# Patient Record
Sex: Female | Born: 1975 | Race: Black or African American | Hispanic: No | Marital: Married | Smoking: Never smoker
Health system: Southern US, Community
[De-identification: ages and names within clinical notes are randomized; demographics above are authoritative.]

## PROBLEM LIST (undated history)

## (undated) DIAGNOSIS — E785 Hyperlipidemia, unspecified: Secondary | ICD-10-CM

## (undated) DIAGNOSIS — R87619 Unspecified abnormal cytological findings in specimens from cervix uteri: Secondary | ICD-10-CM

## (undated) DIAGNOSIS — N6310 Unspecified lump in the right breast, unspecified quadrant: Secondary | ICD-10-CM

## (undated) DIAGNOSIS — N6029 Fibroadenosis of unspecified breast: Secondary | ICD-10-CM

## (undated) DIAGNOSIS — B009 Herpesviral infection, unspecified: Secondary | ICD-10-CM

## (undated) DIAGNOSIS — N883 Incompetence of cervix uteri: Secondary | ICD-10-CM

## (undated) DIAGNOSIS — IMO0002 Reserved for concepts with insufficient information to code with codable children: Secondary | ICD-10-CM

## (undated) DIAGNOSIS — R000 Tachycardia, unspecified: Secondary | ICD-10-CM

## (undated) DIAGNOSIS — Z309 Encounter for contraceptive management, unspecified: Secondary | ICD-10-CM

## (undated) DIAGNOSIS — R87629 Unspecified abnormal cytological findings in specimens from vagina: Secondary | ICD-10-CM

## (undated) HISTORY — DX: Incompetence of cervix uteri: N88.3

## (undated) HISTORY — PX: CERVICAL CERCLAGE: SHX1329

## (undated) HISTORY — DX: Unspecified abnormal cytological findings in specimens from vagina: R87.629

## (undated) HISTORY — DX: Encounter for contraceptive management, unspecified: Z30.9

## (undated) HISTORY — DX: Herpesviral infection, unspecified: B00.9

## (undated) HISTORY — DX: Unspecified lump in the right breast, unspecified quadrant: N63.10

## (undated) HISTORY — DX: Reserved for concepts with insufficient information to code with codable children: IMO0002

## (undated) HISTORY — PX: LEEP: SHX91

## (undated) HISTORY — DX: Tachycardia, unspecified: R00.0

## (undated) HISTORY — DX: Unspecified abnormal cytological findings in specimens from cervix uteri: R87.619

## (undated) HISTORY — DX: Fibroadenosis of unspecified breast: N60.29

## (undated) HISTORY — DX: Hyperlipidemia, unspecified: E78.5

---

## 2004-04-16 ENCOUNTER — Emergency Department (HOSPITAL_COMMUNITY): Admission: EM | Admit: 2004-04-16 | Discharge: 2004-04-16 | Payer: Self-pay | Admitting: Emergency Medicine

## 2007-07-14 ENCOUNTER — Other Ambulatory Visit: Admission: RE | Admit: 2007-07-14 | Discharge: 2007-07-14 | Payer: Self-pay | Admitting: Obstetrics and Gynecology

## 2008-09-29 ENCOUNTER — Other Ambulatory Visit: Admission: RE | Admit: 2008-09-29 | Discharge: 2008-09-29 | Payer: Self-pay | Admitting: Obstetrics and Gynecology

## 2009-04-19 ENCOUNTER — Encounter (INDEPENDENT_AMBULATORY_CARE_PROVIDER_SITE_OTHER): Payer: Self-pay | Admitting: *Deleted

## 2009-04-19 ENCOUNTER — Emergency Department (HOSPITAL_COMMUNITY): Admission: EM | Admit: 2009-04-19 | Discharge: 2009-04-20 | Payer: Self-pay | Admitting: Emergency Medicine

## 2009-04-19 ENCOUNTER — Encounter: Payer: Self-pay | Admitting: Adult Health

## 2009-04-19 LAB — CONVERTED CEMR LAB
Calcium: 8.8 mg/dL
Chloride: 109 meq/L
Potassium: 3.6 meq/L
Sodium: 138 meq/L

## 2009-04-26 ENCOUNTER — Encounter (INDEPENDENT_AMBULATORY_CARE_PROVIDER_SITE_OTHER): Payer: Self-pay | Admitting: *Deleted

## 2009-04-26 ENCOUNTER — Encounter: Payer: Self-pay | Admitting: Adult Health

## 2009-04-26 ENCOUNTER — Ambulatory Visit: Payer: Self-pay | Admitting: Cardiology

## 2009-04-26 DIAGNOSIS — R002 Palpitations: Secondary | ICD-10-CM

## 2009-04-26 DIAGNOSIS — R000 Tachycardia, unspecified: Secondary | ICD-10-CM | POA: Insufficient documentation

## 2009-04-26 DIAGNOSIS — I498 Other specified cardiac arrhythmias: Secondary | ICD-10-CM

## 2009-04-27 ENCOUNTER — Encounter: Payer: Self-pay | Admitting: Adult Health

## 2009-04-27 LAB — CONVERTED CEMR LAB
Free T4: 1.08 ng/dL (ref 0.80–1.80)
T3, Free: 2.9 pg/mL (ref 2.3–4.2)
TSH: 1.477 microintl units/mL (ref 0.350–4.500)

## 2009-05-04 ENCOUNTER — Telehealth (INDEPENDENT_AMBULATORY_CARE_PROVIDER_SITE_OTHER): Payer: Self-pay | Admitting: *Deleted

## 2009-05-05 ENCOUNTER — Ambulatory Visit (HOSPITAL_COMMUNITY): Admission: RE | Admit: 2009-05-05 | Discharge: 2009-05-05 | Payer: Self-pay | Admitting: Cardiology

## 2009-05-05 ENCOUNTER — Encounter: Payer: Self-pay | Admitting: Cardiology

## 2009-05-05 ENCOUNTER — Ambulatory Visit: Payer: Self-pay | Admitting: Cardiology

## 2009-07-03 ENCOUNTER — Ambulatory Visit: Payer: Self-pay | Admitting: Cardiovascular Disease

## 2010-04-17 NOTE — Miscellaneous (Signed)
Summary: labs bmp 04/19/2009  Clinical Lists Changes  Observations: Added new observation of CALCIUM: 8.8 mg/dL (16/12/9602 54:09) Added new observation of GFR AA: >60 mL/min/1.52m2 (04/19/2009 10:14) Added new observation of GFR: >60 mL/min (04/19/2009 10:14) Added new observation of CREATININE: 0.94 mg/dL (81/19/1478 29:56) Added new observation of BUN: 14 mg/dL (21/30/8657 84:69) Added new observation of BG RANDOM: 99 mg/dL (62/95/2841 32:44) Added new observation of CO2 PLSM/SER: 24 meq/L (04/19/2009 10:14) Added new observation of CL SERUM: 109 meq/L (04/19/2009 10:14) Added new observation of K SERUM: 3.6 meq/L (04/19/2009 10:14) Added new observation of NA: 138 meq/L (04/19/2009 10:14)

## 2010-04-17 NOTE — Assessment & Plan Note (Signed)
Summary: post ED visit on 04/19/09 for palpitations/tg   Visit Type:  Initial Consult Primary Provider:  no primary m.d  CC:  palpitations.  History of Present Illness: [name] is a very nice 35 y/o AAF who presents to the office today as a new patient after ER visit for tachycardia.  She has been having symptoms of palpatations, racing heart for approximately 6 months, occuring more often, lasting longer. Sometimes up to or an hour.  Wakes her up at night at times.  She has associated dizziness with these episodes but no chest pain orSOB.  They go away on their own.  On Apr 19, 2009 while at work, she began another episode of palpatations and came to [facility] ER.  She was found to have a documented HR of 125 bpm per EKG.  Lab work obtained was normal, no anemia or hypokalcemia. Negative pregnancy test.  TSH was not drawn. Heart HR settled down on its own and she was sent home without medication but told to follow-up with cardiology.  She conitnues to have these episodes about every other day.  Preventive Screening-Counseling & Management  Alcohol-Tobacco     Alcohol drinks/day: 0     Smoking Status: never  Caffeine-Diet-Exercise     Caffeine use/day: 3     Caffeine Counseling: decrease use of caffeine  Current Medications (verified): 1)  Kariva 0.15-0.02/0.01 Mg (21/5) Tabs (Desogestrel-Ethinyl Estradiol) .... Take 1 Tab Daily  Allergies (verified): No Known Drug Allergies  Family History: Negative FH of Diabetes, Hypertension, or Coronary Artery Disease  Social History: Tobacco Use - No.  Alcohol Use - no Regular Exercise - no Full Time Single  1 daughterAlcohol drinks/day:  0 Caffeine use/day:  3  Review of Systems       Palpatations, dizziness  All other systems have been reviewed and are negative unless stated above.   Vital Signs:  Patient profile:   35 year old female Height:      64 inches Weight:      193 pounds BMI:     33.25 Pulse rate:   79 /  minute BP sitting:   133 / 88  (right arm)  Vitals Entered By: Dreama Saa, CNA (April 26, 2009 2:39 PM)  Physical Exam  General:  Well developed, well nourished, in no acute distress. Head:  normocephalic and atraumatic Eyes:  PERRLA/EOM intact; conjunctiva and lids normal. Ears:  TM's intact and clear with normal canals and hearing Nose:  no deformity, discharge, inflammation, or lesions Mouth:  Teeth, gums and palate normal. Oral mucosa normal. Neck:  Neck supple, no JVD. No masses, thyromegaly or abnormal cervical nodes. Lungs:  Clear bilaterally to auscultation and percussion. Heart:  Non-displaced PMI, chest non-tender; regular rate and rhythm, S1, S2 without murmurs, rubs or gallops. Carotid upstroke normal, no bruit. Normal abdominal aortic size, no bruits. Femorals normal pulses, no bruits. Pedals normal pulses. No edema, no varicosities. Abdomen:  Bowel sounds positive; abdomen soft and non-tender without masses, organomegaly, or hernias noted. No hepatosplenomegaly. Msk:  Back normal, normal gait. Muscle strength and tone normal. Extremities:  No clubbing or cyanosis. Neurologic:  Alert and oriented x 3. Psych:  Normal affect.   EKG  Procedure date:  04/26/2009  Findings:      Normal sinus rhythm with rate of:  87bpm.  Sinus arrythmia  Impression & Recommendations:  Problem # 1:  SINUS TACHYCARDIA (ICD-427.89) Will plan a cardionet monitor for 2 weeks to ascertain frequency of tachycardia  and evaluate for arrythmias.  No evidence of WPW on today's EKG or review of EKG from ER.  Will also have ECHO completed.  The patient was seen by Dr. Dietrich Pates who agrees with my plan and assessment. Orders: Cardionet/Event Monitor (Cardionet/Event)  Problem # 2:  R/O HYPERTHYROIDISM (ICD-242.90) To evaluate possible thyroid disease as etiology, will check thyroid profile. Orders: T-TSH [phone]) T-T3, Free [phone]) T-T4, Free [phone])  Other Orders: 2-D  Echocardiogram (2D Echo)  Patient Instructions: 1)  Your physician recommends that you schedule a follow-up appointment in: Post event monitor 2)  Your physician recommends that you return for lab work DP:OEUMP 3)  Your physician has requested that you have an echocardiogram.  Echocardiography is a painless test that uses sound waves to create images of your heart. It provides your doctor with information about the size and shape of your heart and how well your heart's chambers and valves are working.  This procedure takes approximately one hour. There are no restrictions for this procedure.

## 2010-04-17 NOTE — Assessment & Plan Note (Signed)
Summary: due for f/u/Eileen Combs has no available appts./tg   Visit Type:  Follow-up Primary Provider:  no primary m.d  CC:  fu echo per kathyrn.  History of Present Illness: Patient seen 2/11 by Bailey Mech for palpitations.  Seen in ER and sent home.  Reviewed recent echo which was normal .  TSH/labs are also normal.  Since last seen she has done well with no real symptoms.  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  I think her palpitaiotns are benign and need no further w/u  Current Problems (verified): 1)  R/O Hyperthyroidism  (ICD-242.90) 2)  Palpitations  (ICD-785.1) 3)  Tachycardia  (ICD-785.0) 4)  Sinus Tachycardia  (ICD-427.89)  Current Medications (verified): 1)  Kariva 0.15-0.02/0.01 Mg (21/5) Tabs (Desogestrel-Ethinyl Estradiol) .... Take 1 Tab Daily  Allergies (verified): No Known Drug Allergies  Past History:  Past Medical History: Last updated: 04/26/2009 Current Problems:  SINUS TACHYCARDIA (ICD-427.89)  Past Surgical History: Last updated: 04/26/2009 NO PAST SURGICAL HISTORY  Family History: Last updated: 04/26/2009 Negative FH of Diabetes, Hypertension, or Coronary Artery Disease  Social History: Last updated: 04/26/2009 Tobacco Use - No.  Alcohol Use - no Regular Exercise - no Full Time Single  1 daughter  Review of Systems       Denies fever, malais, weight loss, blurry vision, decreased visual acuity, cough, sputum, SOB, hemoptysis, pleuritic pain, , heartburn, abdominal pain, melena, lower extremity edema, claudication, or rash.   Vital Signs:  Patient profile:   35 year old female Weight:      201 pounds Pulse rate:   81 / minute BP sitting:   124 / 79  (right arm)  Vitals Entered By: Dreama Saa, CNA (July 03, 2009 2:14 PM)  Physical Exam  General:  Affect appropriate Healthy:  appears stated age HEENT: normal Neck supple with no adenopathy JVP normal no bruits no  thyromegaly Lungs clear with no wheezing and good diaphragmatic motion Heart:  S1/S2 no murmur,rub, gallop or click PMI normal Abdomen: benighn, BS positve, no tenderness, no AAA no bruit.  No HSM or HJR Distal pulses intact with no bruits No edema Neuro non-focal Skin warm and dry    Impression & Recommendations:  Problem # 1:  PALPITATIONS (ICD-785.1) Benign with normal Tele, echo, ECG and labs.  Reassurance  Patient Instructions: 1)  Your physician recommends that you schedule a follow-up appointment in: as needed  2)  Your physician recommends that you continue on your current medications as directed. Please refer to the Current Medication list given to you today.

## 2010-04-17 NOTE — Progress Notes (Signed)
  Phone Note Outgoing Call   Call placed by: Teressa Lower RN,  May 04, 2009 12:34 PM Call placed to: Patient Summary of Call: pt is wearing [employer] ACT monitor:  pt had 1 episode ST,SVT rate 195    < 1 minute in duration, pt had some "quickening" in chest but denied actual pain Initial call taken by: Teressa Lower RN,  May 04, 2009 12:39 PM

## 2010-06-06 LAB — CBC
HCT: 34.5 % — ABNORMAL LOW (ref 36.0–46.0)
MCHC: 34.8 g/dL (ref 30.0–36.0)
MCV: 86.6 fL (ref 78.0–100.0)
Platelets: 248 10*3/uL (ref 150–400)
RDW: 13 % (ref 11.5–15.5)
WBC: 5.9 10*3/uL (ref 4.0–10.5)

## 2010-06-06 LAB — PREGNANCY, URINE: Preg Test, Ur: NEGATIVE

## 2010-06-06 LAB — DIFFERENTIAL
Basophils Absolute: 0 10*3/uL (ref 0.0–0.1)
Basophils Relative: 0 % (ref 0–1)
Eosinophils Absolute: 0 10*3/uL (ref 0.0–0.7)
Eosinophils Relative: 1 % (ref 0–5)
Neutrophils Relative %: 72 % (ref 43–77)

## 2010-06-06 LAB — BASIC METABOLIC PANEL
BUN: 14 mg/dL (ref 6–23)
GFR calc non Af Amer: >60 mL/min (ref >60)
Glucose, Bld: 99 mg/dL (ref 70–99)
Potassium: 3.6 mEq/L (ref 3.5–5.1)

## 2010-06-06 LAB — URINALYSIS, ROUTINE W REFLEX MICROSCOPIC
Bilirubin Urine: NEGATIVE
Ketones, ur: NEGATIVE mg/dL
Nitrite: NEGATIVE
Urobilinogen, UA: 0.2 mg/dL (ref 0.0–1.0)
pH: 6 (ref 5.0–8.0)

## 2010-07-10 ENCOUNTER — Other Ambulatory Visit (HOSPITAL_COMMUNITY)
Admission: RE | Admit: 2010-07-10 | Discharge: 2010-07-10 | Disposition: A | Discharge status: Home / Self Care | Payer: Self-pay | Source: Ambulatory Visit | Attending: Obstetrics and Gynecology | Admitting: Obstetrics and Gynecology

## 2010-07-10 ENCOUNTER — Other Ambulatory Visit: Payer: Self-pay | Admitting: Adult Health

## 2010-07-10 DIAGNOSIS — Z01419 Encounter for gynecological examination (general) (routine) without abnormal findings: Secondary | ICD-10-CM | POA: Insufficient documentation

## 2010-12-24 ENCOUNTER — Encounter: Payer: Self-pay | Admitting: Cardiovascular Disease

## 2011-07-11 ENCOUNTER — Other Ambulatory Visit: Payer: Self-pay | Admitting: Obstetrics and Gynecology

## 2011-10-08 ENCOUNTER — Emergency Department (HOSPITAL_COMMUNITY)
Admission: EM | Admit: 2011-10-08 | Discharge: 2011-10-08 | Disposition: A | Discharge status: Home / Self Care | Payer: PRIVATE HEALTH INSURANCE | Attending: Emergency Medicine | Admitting: Emergency Medicine

## 2011-10-08 ENCOUNTER — Encounter (HOSPITAL_COMMUNITY): Payer: Self-pay

## 2011-10-08 DIAGNOSIS — R0602 Shortness of breath: Secondary | ICD-10-CM | POA: Insufficient documentation

## 2011-10-08 DIAGNOSIS — R002 Palpitations: Secondary | ICD-10-CM | POA: Insufficient documentation

## 2011-10-08 DIAGNOSIS — R42 Dizziness and giddiness: Secondary | ICD-10-CM | POA: Insufficient documentation

## 2011-10-08 LAB — CBC WITH DIFFERENTIAL/PLATELET
Basophils Absolute: 0 10*3/uL (ref 0.0–0.1)
Eosinophils Relative: 2 % (ref 0–5)
Lymphocytes Relative: 32 % (ref 12–46)
Lymphs Abs: 1.7 10*3/uL (ref 0.7–4.0)
MCV: 85.3 fL (ref 78.0–100.0)
Neutrophils Relative %: 59 % (ref 43–77)
Platelets: 329 10*3/uL (ref 150–400)
RBC: 4.91 MIL/uL (ref 3.87–5.11)
RDW: 12.5 % (ref 11.5–15.5)
WBC: 5.3 10*3/uL (ref 4.0–10.5)

## 2011-10-08 LAB — BASIC METABOLIC PANEL
CO2: 24 mEq/L (ref 19–32)
Calcium: 10.2 mg/dL (ref 8.4–10.5)
GFR calc non Af Amer: 65 mL/min — ABNORMAL LOW (ref >90)
Glucose, Bld: 104 mg/dL — ABNORMAL HIGH (ref 70–99)
Potassium: 3.9 mEq/L (ref 3.5–5.1)
Sodium: 138 mEq/L (ref 135–145)

## 2011-10-08 MED ORDER — SODIUM CHLORIDE 0.9 % IV BOLUS (SEPSIS)
1000.0000 mL | Freq: Once | INTRAVENOUS | Status: AC
  Administered 2011-10-08: 1000 mL via INTRAVENOUS

## 2011-10-08 MED ORDER — METOPROLOL TARTRATE 50 MG PO TABS
25.0000 mg | ORAL_TABLET | Freq: Once | ORAL | Status: AC
  Administered 2011-10-08: 19:00:00 via ORAL
  Filled 2011-10-08: qty 1

## 2011-10-08 MED ORDER — METOPROLOL TARTRATE 25 MG PO TABS: 25.0000 mg | ORAL_TABLET | Freq: Two times a day (BID) | ORAL | Status: DC

## 2011-10-08 NOTE — ED Notes (Signed)
Pt reports heart has been racing since 2 pm.  C/O SOB.  Went to [facility] and was sent here for evaluation.

## 2011-10-08 NOTE — ED Notes (Signed)
Patient ambulated at doctor's request.  Patient's resting heart rate was 95 bpm.  After walking length of hall - patient's was at 125 bpm,

## 2011-10-08 NOTE — ED Provider Notes (Signed)
History     CSN: [id number]  Arrival date & time 10/08/11  1613   First MD Initiated Contact with Patient 10/08/11 1627      Chief Complaint  Patient presents with  . Tachycardia     Patient is a 36 y.o. female presenting with palpitations. The history is provided by the patient.  Palpitations  This is a new problem. The current episode started 1 to 2 hours ago. The problem occurs constantly. The problem has been gradually worsening. Associated with: nothing. On average, each episode lasts 2 hours. Associated symptoms include dizziness and shortness of breath. Pertinent negatives include no diaphoresis, no fever, no chest pain, no orthopnea, no PND, no syncope, no vomiting, no headaches and no cough. She has tried nothing for the symptoms. The treatment provided no relief.  pt reports similar history previously but never had a diagnosis She denies chest pain No h/o DVT/PE She is a nonsmoker No h/o syncope previously  Past Medical History  Diagnosis Date  . Sinus tachycardia     History reviewed. No pertinent past surgical history.  Family History  Problem Relation Age of Onset  . Hypertension Neg Hx   . Coronary artery disease Neg Hx   . Diabetes Neg Hx     History  Substance Use Topics  . Smoking status: Never Smoker   . Smokeless tobacco: Not on file  . Alcohol Use: No    OB History    Grav Para Term Preterm Abortions TAB SAB Ect Mult Living                  Review of Systems  Constitutional: Negative for fever and diaphoresis.  Respiratory: Positive for shortness of breath. Negative for cough.   Cardiovascular: Positive for palpitations. Negative for chest pain, orthopnea, syncope and PND.  Gastrointestinal: Negative for vomiting and diarrhea.  Genitourinary: Negative for vaginal bleeding.  Neurological: Positive for dizziness. Negative for headaches.  All other systems reviewed and are negative.    Allergies  Review of patient's allergies indicates no  known allergies.  Home Medications   Current Outpatient Rx  Name Route Sig Dispense Refill  . DESOGESTREL-ETHINYL ESTRADIOL 0.15-0.02/0.01 MG (21/5) PO TABS Oral Take 1 tablet by mouth daily.        BP 142/96  Pulse 164  Temp 98.3 F (36.8 C) (Oral)  Ht 5\' 4"  (1.626 m)  Wt 185 lb (83.915 kg)  BMI 31.76 kg/m2  SpO2 99%  LMP 09/08/2011  Physical Exam CONSTITUTIONAL: Well developed/well nourished HEAD AND FACE: Normocephalic/atraumatic EYES: EOMI/PERRL ENMT: Mucous membranes moist NECK: supple no meningeal signs SPINE:entire spine nontender CV: tachycardia, s1/s2, no murmurs/rubs/gallops noted LUNGS: Lungs are clear to auscultation bilaterally, no apparent distress ABDOMEN: soft, nontender, no rebound or guarding GU:no cva tenderness NEURO: Pt is awake/alert, moves all extremitiesx4 EXTREMITIES: pulses normal, full ROM, no edema noted SKIN: warm, color normal PSYCH: no abnormalities of mood noted  ED Course  Procedures   Labs Reviewed  CBC WITH DIFFERENTIAL  BASIC METABOLIC PANEL    5:35 PM Pt improved HR around 100 Will call cardiology to see if meds should be started 5:50 PM D/w dr Shirlee Latch We discussed normal echo/thyroid studies previously Discussed that HR is now in the 90s I doubt WPW.  I doubt PE, or ACS at this time He recommended start metoprolol 25 po bid and f/u Pt felt well during ambulation, no complaints  Repeat EKG  Date: 10/08/2011 1833  Rate: 94  Rhythm: normal sinus  rhythm  QRS Axis: normal  Intervals: normal  ST/T Wave abnormalities: normal  Conduction Disutrbances:none    MDM  Nursing notes including past medical history and social history reviewed and considered in documentation All labs/vitals reviewed and considered Previous records reviewed and considered - h/o palpitations, had normal echo in 2011, had normal thyroid studies per cardiology noted       Date: 10/08/2011  Rate: 160  Rhythm: sinus tachycardia  QRS Axis:  normal  Intervals: normal  ST/T Wave abnormalities: normal  Conduction Disutrbances:none  Narrative Interpretation:   Old EKG Reviewed: changes noted Rate is faster than previous EKG   Joya Gaskins, MD 10/08/11 1913

## 2011-10-14 ENCOUNTER — Encounter: Payer: Self-pay | Admitting: Adult Health

## 2011-10-14 ENCOUNTER — Ambulatory Visit (INDEPENDENT_AMBULATORY_CARE_PROVIDER_SITE_OTHER): Payer: PRIVATE HEALTH INSURANCE | Admitting: Adult Health

## 2011-10-14 VITALS — BP 124/76 | HR 88 | Ht 64.0 in | Wt 192.1 lb

## 2011-10-14 DIAGNOSIS — I498 Other specified cardiac arrhythmias: Secondary | ICD-10-CM

## 2011-10-14 MED ORDER — METOPROLOL TARTRATE 25 MG PO TABS: 25.0000 mg | ORAL_TABLET | Freq: Two times a day (BID) | ORAL | Status: DC

## 2011-10-14 NOTE — Progress Notes (Signed)
   HPI: Ms. Eileen Combs is a 36 year old female patient, followed by Dr. Eden Emms, in the past. She is here to be seen on followup after coming to emergency room secondary to palpitations on 10/08/2011. She had been seen in the office in 2011  by Dr. Charlton Haws after ER visit for same in 2011. Prior to that an echocardiogram, outpatient heart monitor, and TSH were performed. All were found to be within normal limits at that time. He felt her palpitations were benign.. She was to be seen on a prn basis.    She was seen in the emergency room on 10/08/2011 with an episode of palpitations and tachycardia lasting approximately 2 hours. Emergency room vital signs reveal a pulse of 164 beats per minute with blood pressure of 142/96. She was treated with metoprolol 25 mg by mouth.She was  given a bolus of normal saline. Followup EKG revealed normal sinus rhythm with a rate of 94 beats per minute approximately 2 hours later.. Dr. Bebe Shaggy, ER physician, did speak with Dr. Cherie Ouch cardiology who advised that she be sent home on metoprolol 25 mg twice a day.    Since beginning the medication she has not had any recurrent events. Blood pressure normalized. She denies excessive use of caffeine. She does not have a history of anxiety or panic attacks. She wonders if this could be related to her birth control medication.   No Known Allergies  Current Outpatient Prescriptions  Medication Sig Dispense Refill  . desogestrel-ethinyl estradiol (KARIVA,AZURETTE,MIRCETTE) 0.15-0.02/0.01 MG (21/5) tablet Take 1 tablet by mouth daily.        . metoprolol tartrate (LOPRESSOR) 25 MG tablet Take 1 tablet (25 mg total) by mouth 2 (two) times daily.  60 tablet  10  . DISCONTD: metoprolol (LOPRESSOR) 25 MG tablet Take 1 tablet (25 mg total) by mouth 2 (two) times daily.  14 tablet  0  . DISCONTD: metoprolol tartrate (LOPRESSOR) 25 MG tablet Take 1 tablet (25 mg total) by mouth 2 (two) times daily.  60 tablet  10    Past Medical  History  Diagnosis Date  . Sinus tachycardia      ZOX:WRUEAV of systems complete and found to be negative unless listed above  PHYSICAL EXAM BP 124/76  Pulse 88  Ht 5\' 4"  (1.626 m)  Wt 192 lb 1.9 oz (87.145 kg)  BMI 32.98 kg/m2  LMP 09/08/2011  General: Well developed, well nourished, in no acute distress Head: Eyes PERRLA, No xanthomas.   Normal cephalic and atramatic  Lungs: Clear bilaterally to auscultation and percussion. Heart: HRRR S1 S2, without MRG.  Pulses are 2+ & equal.            No carotid bruit. No JVD.  No abdominal bruits. No femoral bruits. Abdomen: Bowel sounds are positive, abdomen soft and non-tender without masses or                  Hernia's noted. Msk:  Back normal, normal gait. Normal strength and tone for age. Extremities: No clubbing, cyanosis or edema.  DP +1 Neuro: Alert and oriented X 3. Psych:  Good affect, responds appropriately  EKG:NSR no evidence of WPW.  ASSESSMENT AND PLAN

## 2011-10-14 NOTE — Assessment & Plan Note (Addendum)
I have reviewed the EKG taken during her ER visit. I have also asked Dr. Dietrich Pates to over read the EKG with me. There is no evidence of pre-excitation abnormalities. It appears that she was having an episode of SVT. This resolved with use of metoprolol. I will continue her on metoprolol 25 mg twice a day, Rx provided.. May consider repeating outpatient heart monitor if she has breakthrough tachycardia. May consider sending her to see EP specialist if she continues to have symptoms. At this time we will not recheck TSH or echocardiogram.   Concerning the use of birth control medication causing these episodes,; she has them very intermittently , they are not exacerbated by any particular event or activity. I have advised her to followup with her OB/GYN specialist to discuss this further. We will see her again in 6 months unless she is symptomatic.

## 2011-10-14 NOTE — Patient Instructions (Addendum)
Your physician recommends that you continue on your current medications as directed. Please refer to the Current Medication list given to you today.  Your physician wants you to follow-up in: 6 months with Kathryn. You will receive a reminder letter in the mail two months in advance. If you don't receive a letter, please call our office to schedule the follow-up appointment.  

## 2012-07-09 ENCOUNTER — Encounter: Payer: Self-pay | Admitting: *Deleted

## 2012-07-10 ENCOUNTER — Other Ambulatory Visit (HOSPITAL_COMMUNITY)
Admission: RE | Admit: 2012-07-10 | Discharge: 2012-07-10 | Disposition: A | Discharge status: Home / Self Care | Payer: PRIVATE HEALTH INSURANCE | Source: Ambulatory Visit | Attending: Adult Health | Admitting: Adult Health

## 2012-07-10 ENCOUNTER — Ambulatory Visit (INDEPENDENT_AMBULATORY_CARE_PROVIDER_SITE_OTHER): Payer: PRIVATE HEALTH INSURANCE | Admitting: Adult Health

## 2012-07-10 ENCOUNTER — Encounter: Payer: Self-pay | Admitting: Adult Health

## 2012-07-10 VITALS — BP 120/76 | HR 76 | Ht 64.0 in | Wt 210.0 lb

## 2012-07-10 DIAGNOSIS — Z01419 Encounter for gynecological examination (general) (routine) without abnormal findings: Secondary | ICD-10-CM

## 2012-07-10 DIAGNOSIS — Z1151 Encounter for screening for human papillomavirus (HPV): Secondary | ICD-10-CM | POA: Insufficient documentation

## 2012-07-10 DIAGNOSIS — Z309 Encounter for contraceptive management, unspecified: Secondary | ICD-10-CM

## 2012-07-10 DIAGNOSIS — Z0000 Encounter for general adult medical examination without abnormal findings: Secondary | ICD-10-CM

## 2012-07-10 MED ORDER — NORETHIN-ETH ESTRAD-FE BIPHAS 1 MG-10 MCG / 10 MCG PO TABS: 1.0000 | ORAL_TABLET | Freq: Every day | ORAL | Status: DC

## 2012-07-10 NOTE — Patient Instructions (Addendum)
Start lo loestrin this Sunday and use condoms x 1 month  Follow here in 3 months Sign up for my chart

## 2012-07-10 NOTE — Progress Notes (Signed)
Patient ID: Eileen Combs, female   DOB: Apr 30, 1975, 37 y.o.   MRN: [id number] History of Present Illness: Eileen Combs is a 37 year old black female in for a pap and physical.   Current Medications, Allergies, Past Medical History, Past Surgical History, Family History and Social History were reviewed in [facility] electronic medical record.     Review of Systems:Patient denies any headaches, blurred vision, shortness of breath, chest pain, abdominal pain, problems with bowel movements, urination, or intercourse. No musculoskeletal pain, no mood changes. She has had palpations and is taking lopressor, and her PCP said her birth control may contribute to this.Had labs that were normal per pt. This has been happening over a year.   Physical Exam:Blood pressure 120/76, pulse 76, height 5\' 4"  (1.626 m), weight 210 lb (95.255 kg), last menstrual period 06/19/2012. General:  Well developed, well nourished, no acute distress Skin:  Warm and dry Neck:  Midline trachea, normal thyroid Lungs; Clear to auscultation bilaterally Breast:  No dominant palpable mass, retraction, or nipple discharge Cardiovascular: Regular rate and rhythm Abdomen:  Soft, non tender, no hepatosplenomegaly Pelvic:  External genitalia is normal in appearance.  The vagina is normal in appearance. The cervix is bulbous. Pap performed with HPV.  Uterus is felt to be normal size, shape, and contour.  No adnexal masses or tenderness noted. Extremities:  No swelling or varicosities noted Psych:  No mood changes, alert and cooperative.  Impression: Yearly exam  Contraceptive management  Plan: Change birth control pills to Lo loestrin which is a lower dose. Start them this Sunday and use condoms x 1 pack, samples given: Number of samples 4  Lot number [id number]    Exp date 5/15 Follow up in 3 months Physical in 1 year

## 2012-07-12 ENCOUNTER — Other Ambulatory Visit: Payer: Self-pay | Admitting: Obstetrics and Gynecology

## 2012-08-11 ENCOUNTER — Telehealth: Payer: Self-pay | Admitting: Obstetrics and Gynecology

## 2012-08-11 NOTE — Telephone Encounter (Signed)
Pt informed of WNL Pap results (07/10/12).

## 2012-10-09 ENCOUNTER — Ambulatory Visit: Payer: PRIVATE HEALTH INSURANCE | Admitting: Adult Health

## 2012-10-13 ENCOUNTER — Encounter: Payer: Self-pay | Admitting: Adult Health

## 2012-10-13 ENCOUNTER — Ambulatory Visit (INDEPENDENT_AMBULATORY_CARE_PROVIDER_SITE_OTHER): Payer: PRIVATE HEALTH INSURANCE | Admitting: Adult Health

## 2012-10-13 VITALS — BP 110/80 | Ht 64.0 in | Wt 209.0 lb

## 2012-10-13 DIAGNOSIS — Z3049 Encounter for surveillance of other contraceptives: Secondary | ICD-10-CM

## 2012-10-13 DIAGNOSIS — Z309 Encounter for contraceptive management, unspecified: Secondary | ICD-10-CM

## 2012-10-13 HISTORY — DX: Encounter for contraceptive management, unspecified: Z30.9

## 2012-10-13 MED ORDER — DESOGESTREL-ETHINYL ESTRADIOL 0.15-0.02/0.01 MG (21/5) PO TABS: ORAL_TABLET | ORAL | Status: DC

## 2012-10-13 NOTE — Patient Instructions (Addendum)
Finish the lo loesrtin and the start [place] Use condoms Follow up in 1 year

## 2012-10-13 NOTE — Progress Notes (Signed)
Subjective:     Patient ID: Eileen Combs, female   DOB: November 07, 1975, 37 y.o.   MRN: [id number]  HPI Eileen Combs is back in follow up of changing OCs to see if helps with palpitations and it did not and she wants to go back to prior pills.which was [place].   Review of Systems Positives in HPI Reviewed past medical,surgical, social and family history. Reviewed medications and allergies.     Objective:   Physical Exam BP 110/80  Ht 5\' 4"  (1.626 m)  Wt 209 lb (94.802 kg)  BMI 35.86 kg/m2  LMP 09/30/2012   Talk only, she wants to go back to [employer], she had a 2 week period with lo loestin x 1.and no change in heart palpitations.  Assessment:      Contraceptive management    Plan:      Refilled kariva x 1 year to start after finishes this pack of lo loestrin and use condoms Follow up in 1 year and prn

## 2012-11-05 ENCOUNTER — Other Ambulatory Visit: Payer: Self-pay | Admitting: Adult Health

## 2012-11-05 MED ORDER — DESOGESTREL-ETHINYL ESTRADIOL 0.15-0.02/0.01 MG (21/5) PO TABS: ORAL_TABLET | ORAL | Status: DC

## 2013-05-20 ENCOUNTER — Ambulatory Visit (INDEPENDENT_AMBULATORY_CARE_PROVIDER_SITE_OTHER): Payer: 59 | Admitting: Adult Health

## 2013-05-20 ENCOUNTER — Encounter: Payer: Self-pay | Admitting: Adult Health

## 2013-05-20 ENCOUNTER — Encounter (INDEPENDENT_AMBULATORY_CARE_PROVIDER_SITE_OTHER): Payer: Self-pay

## 2013-05-20 VITALS — BP 120/82 | Ht 64.0 in | Wt 218.0 lb

## 2013-05-20 DIAGNOSIS — N63 Unspecified lump in unspecified breast: Secondary | ICD-10-CM

## 2013-05-20 DIAGNOSIS — N6310 Unspecified lump in the right breast, unspecified quadrant: Secondary | ICD-10-CM

## 2013-05-20 HISTORY — DX: Unspecified lump in the right breast, unspecified quadrant: N63.10

## 2013-05-20 NOTE — Progress Notes (Signed)
Subjective:     Patient ID: Eileen Combs, female   DOB: 06-22-1975, 38 y.o.   MRN: [id number]  HPI Eileen Combs is a 38 year old black female in complaining of right breast lump x 1 month.can be tender.  Review of Systems See HPI Reviewed past medical,surgical, social and family history. Reviewed medications and allergies.     Objective:   Physical Exam BP 120/82  Ht 5\' 4"  (1.626 m)  Wt 218 lb (98.884 kg)  BMI 37.40 kg/m2  LMP 05/19/2013    Skin warm and dry,  Breasts:no dominate palpable mass, retraction or nipple discharge on left, on right no retraction or nipple discharge but has marble size mass under nipple, tender if she misses with it.Has bilateral irregularities. .  Assessment:     Right breast mass    Plan:     [facility] bilateral [facility] handout on breast cyst Follow up prn

## 2013-05-20 NOTE — Patient Instructions (Signed)
Get mammogram 3/18 at 1:45 pm be there at 1:30 pm  Follow up prn Breast Cyst A breast cyst is a sac in the breast that is filled with fluid. Breast cysts are common in women. Women can have one or many cysts. When the breasts contain many cysts, it is usually due to a noncancerous (benign) condition called fibrocystic change. These lumps form under the influence of female hormones (estrogen and progesterone). The lumps are most often located in the upper, outer portion of the breast. They are often more swollen, painful, and tender before your period starts. They usually disappear after menopause, unless you are on hormone therapy.  There are several types of cysts:  Macrocyst. This is a cyst that is about 2 in. (5.1 cm) in diameter.   Microcyst. This is a tiny cyst that you cannot feel but can be seen with a mammogram or an ultrasound.   Galactocele. This is a cyst containing milk that may develop if you suddenly stop breastfeeding.   Sebaceous cyst of the skin. This type of cyst is not in the breast tissue itself. Breast cysts do not increase your risk of breast cancer. However, they must be monitored closely because they can be cancerous.  CAUSES  It is not known exactly what causes a breast cyst to form. Possible causes include:  An overgrowth of milk glands and connective tissue in the breast can block the milk glands, causing them to fill with fluid.   Scar tissue in the breast from previous surgery may block the glands, causing a cyst.  RISK FACTORS Estrogen may influence the development of a breast cyst.  SIGNS AND SYMPTOMS   Feeling a smooth, round, soft lump (like a grape) in the breast that is easily moveable.   Breast discomfort or pain.  Increase in size of the lump before your menstrual period and decrease in its size after your menstrual period.  DIAGNOSIS  A cyst can be felt during a physical exam by your health care provider. A breast X-ray exam (mammogram) and  ultrasonography will be done to confirm the diagnosis. Fluid may be removed from the cyst with a needle (fine needle aspiration) to make sure the cyst is not cancerous.  TREATMENT  Treatment may not be necessary. Your health care provider may monitor the cyst to see if it goes away on its own. If treatment is needed, it may include:  Hormone treatment.   Needle aspiration. There is a chance of the cyst coming back after aspiration.   Surgery to remove the whole cyst.  HOME CARE INSTRUCTIONS   Keep all follow-up appointments with your health care provider.  See your health care provider regularly:  Get a yearly exam by your health care provider.  Have a clinical breast exam by a health care provider every 1 3 years if you are 29 38 years of age. After age 71 years, you should have the exam every year.   Get mammogram tests as directed by your health care provider.   Understand the normal appearance and feel of your breasts and perform breast self-exams.   Only take over-the-counter or prescription medicines as directed by your health care provider.   Wear a supportive bra, especially when exercising.   Avoid caffeine.   Reduce your salt intake, especially before your menstrual period. Too much salt can cause fluid retention, breast swelling, and discomfort.  SEEK MEDICAL CARE IF:   You feel, or think you feel, a lump in your  breast.   You notice that both breasts look or feel different than usual.   Your breast is still causing pain after your menstrual period is over.   You need medicine for breast pain and swelling that occurs with your menstrual period.  SEEK IMMEDIATE MEDICAL CARE IF:   You have severe pain, tenderness, redness, or warmth in your breast.   You have nipple discharge or bleeding.   Your breast lump becomes hard and painful.   You find new lumps or bumps that were not there before.   You feel lumps in your armpit (axilla).   You  notice dimpling or wrinkling of the breast or nipple.   You have a fever.  MAKE SURE YOU:  Understand these instructions.  Will watch your condition.  Will get help right away if you are not doing well or get worse. Document Released: 03/04/2005 Document Revised: 11/04/2012 Document Reviewed: 10/01/2012 [facility] Patient Information 2014 [place], [place].

## 2013-05-26 ENCOUNTER — Encounter (HOSPITAL_COMMUNITY): Payer: PRIVATE HEALTH INSURANCE

## 2013-06-02 ENCOUNTER — Other Ambulatory Visit: Payer: Self-pay | Admitting: Adult Health

## 2013-06-02 ENCOUNTER — Ambulatory Visit (HOSPITAL_COMMUNITY)
Admission: RE | Admit: 2013-06-02 | Discharge: 2013-06-02 | Disposition: A | Discharge status: Home / Self Care | Payer: 59 | Source: Ambulatory Visit | Attending: Adult Health | Admitting: Adult Health

## 2013-06-02 DIAGNOSIS — N6310 Unspecified lump in the right breast, unspecified quadrant: Secondary | ICD-10-CM

## 2013-06-02 DIAGNOSIS — N63 Unspecified lump in unspecified breast: Secondary | ICD-10-CM | POA: Insufficient documentation

## 2013-06-03 ENCOUNTER — Telehealth: Payer: Self-pay | Admitting: *Deleted

## 2013-06-03 NOTE — Telephone Encounter (Signed)
Pt has question about diet pills?

## 2013-06-04 NOTE — Telephone Encounter (Signed)
Left message I called and she can call back

## 2013-06-04 NOTE — Telephone Encounter (Signed)
Left message  To call back next week

## 2013-06-07 ENCOUNTER — Telehealth: Payer: Self-pay | Admitting: Adult Health

## 2013-06-07 NOTE — Telephone Encounter (Signed)
Make appt to get labs and discuss

## 2013-06-07 NOTE — Telephone Encounter (Signed)
This patient wants to talk to you about diet pills.

## 2013-06-11 ENCOUNTER — Other Ambulatory Visit: Payer: 59

## 2013-06-11 DIAGNOSIS — Z0000 Encounter for general adult medical examination without abnormal findings: Secondary | ICD-10-CM

## 2013-06-11 DIAGNOSIS — Z13220 Encounter for screening for lipoid disorders: Secondary | ICD-10-CM

## 2013-06-11 DIAGNOSIS — Z1329 Encounter for screening for other suspected endocrine disorder: Secondary | ICD-10-CM

## 2013-06-11 LAB — COMPREHENSIVE METABOLIC PANEL
ALK PHOS: 62 U/L (ref 39–117)
ALT: 10 U/L (ref 0–35)
AST: 14 U/L (ref 0–37)
Albumin: 3.8 g/dL (ref 3.5–5.2)
BILIRUBIN TOTAL: 0.8 mg/dL (ref 0.2–1.2)
BUN: 13 mg/dL (ref 6–23)
CO2: 23 mEq/L (ref 19–32)
CREATININE: 0.92 mg/dL (ref 0.50–1.10)
Calcium: 9.3 mg/dL (ref 8.4–10.5)
Chloride: 104 mEq/L (ref 96–112)
Glucose, Bld: 81 mg/dL (ref 70–99)
POTASSIUM: 5.2 meq/L (ref 3.5–5.3)
Sodium: 138 mEq/L (ref 135–145)
Total Protein: 7 g/dL (ref 6.0–8.3)

## 2013-06-11 LAB — LIPID PANEL
CHOL/HDL RATIO: 2.8 ratio
Cholesterol: 197 mg/dL (ref 0–200)
HDL: 70 mg/dL (ref >39)
LDL CALC: 115 mg/dL — AB (ref 0–99)
TRIGLYCERIDES: 60 mg/dL (ref <150)
VLDL: 12 mg/dL (ref 0–40)

## 2013-06-11 LAB — CBC
HEMATOCRIT: 36.2 % (ref 36.0–46.0)
Hemoglobin: 12.3 g/dL (ref 12.0–15.0)
MCH: 28.9 pg (ref 26.0–34.0)
MCHC: 34 g/dL (ref 30.0–36.0)
MCV: 85 fL (ref 78.0–100.0)
Platelets: 329 10*3/uL (ref 150–400)
RBC: 4.26 MIL/uL (ref 3.87–5.11)
RDW: 14.3 % (ref 11.5–15.5)
WBC: 6 10*3/uL (ref 4.0–10.5)

## 2013-06-12 ENCOUNTER — Telehealth: Payer: Self-pay | Admitting: Adult Health

## 2013-06-12 LAB — TSH: TSH: 1.424 u[IU]/mL (ref 0.350–4.500)

## 2013-06-12 NOTE — Telephone Encounter (Signed)
Left message labs look good can call me back

## 2013-09-28 ENCOUNTER — Ambulatory Visit (INDEPENDENT_AMBULATORY_CARE_PROVIDER_SITE_OTHER): Payer: 59 | Admitting: Adult Health

## 2013-09-28 ENCOUNTER — Encounter: Payer: Self-pay | Admitting: Adult Health

## 2013-09-28 VITALS — BP 140/88 | HR 76 | Ht 64.0 in | Wt 217.5 lb

## 2013-09-28 DIAGNOSIS — R350 Frequency of micturition: Secondary | ICD-10-CM

## 2013-09-28 DIAGNOSIS — Z3041 Encounter for surveillance of contraceptive pills: Secondary | ICD-10-CM

## 2013-09-28 DIAGNOSIS — Z01419 Encounter for gynecological examination (general) (routine) without abnormal findings: Secondary | ICD-10-CM

## 2013-09-28 LAB — POCT URINALYSIS DIPSTICK
Blood, UA: NEGATIVE
Glucose, UA: NEGATIVE
LEUKOCYTES UA: NEGATIVE
Nitrite, UA: NEGATIVE

## 2013-09-28 MED ORDER — DESOGESTREL-ETHINYL ESTRADIOL 0.15-0.02/0.01 MG (21/5) PO TABS: ORAL_TABLET | ORAL | Status: DC

## 2013-09-28 NOTE — Patient Instructions (Signed)

## 2013-09-28 NOTE — Progress Notes (Signed)
Patient ID: Eileen Combs, female   DOB: 1975-07-10, 38 y.o.   MRN: [id number] History of Present Illness:  Eileen Combs is a 38 year old black female, in for a physical.She had a normal pap with negative HPV 07/10/12.She to have a wisdom tooth pulled today.Hap with her OCs, has had void more frequently.BMS about 1-2 x weekly.  Current Medications, Allergies, Past Medical History, Past Surgical History, Family History and Social History were reviewed in [facility] electronic medical record.     Review of Systems: Patient denies any headaches, blurred vision, shortness of breath, chest pain, abdominal pain, problems with bowel movements, urination, or intercourse.  No joint pain or mood swings, would like to lose weight.   Physical Exam:BP 140/88  Pulse 76  Ht 5\' 4"  (1.626 m)  Wt 217 lb 8 oz (98.657 kg)  BMI 37.32 kg/m2  LMP 07/03/2015urine trace protein General:  Well developed, well nourished, no acute distress Skin:  Warm and dry Neck:  Midline trachea, normal thyroid Lungs; Clear to auscultation bilaterally Breast:  No dominant palpable mass, retraction, or nipple discharge Cardiovascular: Regular rate and rhythm Abdomen:  Soft, non tender, no hepatosplenomegaly Pelvic:  External genitalia is normal in appearance.  The vagina is normal in appearance.  The cervix is bulbous.  Uterus is felt to be normal size, shape, and contour.  No  adnexal masses or tenderness noted. Extremities:  No swelling or varicosities noted Psych:  No mood changes, alert and cooperative,seems happy   Impression: Yearly gyn  Exam no pap Contraceptive management Urinary frequency    Plan: Refilled vivorelle x 1 year Physical in 1 year Mammogram at 40 Try weight loss by counting calories and increase exercise, could try Alli OTC

## 2013-11-04 ENCOUNTER — Telehealth: Payer: Self-pay | Admitting: Adult Health

## 2014-01-17 ENCOUNTER — Encounter: Payer: Self-pay | Admitting: Adult Health

## 2014-05-05 ENCOUNTER — Ambulatory Visit (INDEPENDENT_AMBULATORY_CARE_PROVIDER_SITE_OTHER): Payer: PRIVATE HEALTH INSURANCE | Admitting: Otolaryngology

## 2014-05-05 DIAGNOSIS — K219 Gastro-esophageal reflux disease without esophagitis: Secondary | ICD-10-CM

## 2014-05-05 DIAGNOSIS — J310 Chronic rhinitis: Secondary | ICD-10-CM

## 2014-06-02 ENCOUNTER — Ambulatory Visit (INDEPENDENT_AMBULATORY_CARE_PROVIDER_SITE_OTHER): Payer: PRIVATE HEALTH INSURANCE | Admitting: Otolaryngology

## 2014-06-02 DIAGNOSIS — J310 Chronic rhinitis: Secondary | ICD-10-CM | POA: Diagnosis not present

## 2014-06-30 ENCOUNTER — Ambulatory Visit (INDEPENDENT_AMBULATORY_CARE_PROVIDER_SITE_OTHER): Payer: 59 | Admitting: Otolaryngology

## 2014-06-30 DIAGNOSIS — J343 Hypertrophy of nasal turbinates: Secondary | ICD-10-CM | POA: Diagnosis not present

## 2014-06-30 DIAGNOSIS — J310 Chronic rhinitis: Secondary | ICD-10-CM | POA: Diagnosis not present

## 2014-08-03 ENCOUNTER — Emergency Department (HOSPITAL_COMMUNITY): Payer: BLUE CROSS/BLUE SHIELD

## 2014-08-03 ENCOUNTER — Emergency Department (HOSPITAL_COMMUNITY)
Admission: EM | Admit: 2014-08-03 | Discharge: 2014-08-03 | Disposition: A | Discharge status: Home / Self Care | Payer: BLUE CROSS/BLUE SHIELD | Attending: Emergency Medicine | Admitting: Emergency Medicine

## 2014-08-03 ENCOUNTER — Encounter (HOSPITAL_COMMUNITY): Payer: Self-pay | Admitting: Emergency Medicine

## 2014-08-03 DIAGNOSIS — Z3202 Encounter for pregnancy test, result negative: Secondary | ICD-10-CM | POA: Insufficient documentation

## 2014-08-03 DIAGNOSIS — R000 Tachycardia, unspecified: Secondary | ICD-10-CM | POA: Diagnosis present

## 2014-08-03 DIAGNOSIS — Z79899 Other long term (current) drug therapy: Secondary | ICD-10-CM | POA: Insufficient documentation

## 2014-08-03 DIAGNOSIS — E785 Hyperlipidemia, unspecified: Secondary | ICD-10-CM | POA: Insufficient documentation

## 2014-08-03 DIAGNOSIS — Z8619 Personal history of other infectious and parasitic diseases: Secondary | ICD-10-CM | POA: Diagnosis not present

## 2014-08-03 DIAGNOSIS — Z8742 Personal history of other diseases of the female genital tract: Secondary | ICD-10-CM | POA: Insufficient documentation

## 2014-08-03 DIAGNOSIS — I479 Paroxysmal tachycardia, unspecified: Secondary | ICD-10-CM | POA: Diagnosis not present

## 2014-08-03 DIAGNOSIS — Z793 Long term (current) use of hormonal contraceptives: Secondary | ICD-10-CM | POA: Diagnosis not present

## 2014-08-03 LAB — BASIC METABOLIC PANEL
ANION GAP: 6 (ref 5–15)
BUN: 15 mg/dL (ref 6–20)
CALCIUM: 9.1 mg/dL (ref 8.9–10.3)
CO2: 24 mmol/L (ref 22–32)
CREATININE: 1.11 mg/dL — AB (ref 0.44–1.00)
Chloride: 108 mmol/L (ref 101–111)
GFR calc Af Amer: >60 mL/min (ref >60)
GFR calc non Af Amer: >60 mL/min (ref >60)
GLUCOSE: 115 mg/dL — AB (ref 65–99)
Potassium: 4.3 mmol/L (ref 3.5–5.1)
SODIUM: 138 mmol/L (ref 135–145)

## 2014-08-03 LAB — CBC WITH DIFFERENTIAL/PLATELET
Basophils Absolute: 0 10*3/uL (ref 0.0–0.1)
Basophils Relative: 0 % (ref 0–1)
EOS ABS: 0.1 10*3/uL (ref 0.0–0.7)
Eosinophils Relative: 1 % (ref 0–5)
HCT: 37.8 % (ref 36.0–46.0)
Hemoglobin: 12.7 g/dL (ref 12.0–15.0)
Lymphocytes Relative: 20 % (ref 12–46)
Lymphs Abs: 1.4 10*3/uL (ref 0.7–4.0)
MCH: 29.3 pg (ref 26.0–34.0)
MCHC: 33.6 g/dL (ref 30.0–36.0)
MCV: 87.1 fL (ref 78.0–100.0)
MONOS PCT: 6 % (ref 3–12)
Monocytes Absolute: 0.4 10*3/uL (ref 0.1–1.0)
Neutro Abs: 4.8 10*3/uL (ref 1.7–7.7)
Neutrophils Relative %: 73 % (ref 43–77)
PLATELETS: 305 10*3/uL (ref 150–400)
RBC: 4.34 MIL/uL (ref 3.87–5.11)
RDW: 12.8 % (ref 11.5–15.5)
WBC: 6.7 10*3/uL (ref 4.0–10.5)

## 2014-08-03 LAB — TROPONIN I: Troponin I: <0.03 ng/mL (ref <0.031)

## 2014-08-03 LAB — PREGNANCY, URINE: Preg Test, Ur: NEGATIVE

## 2014-08-03 MED ORDER — METOPROLOL TARTRATE 25 MG PO TABS: ORAL_TABLET | ORAL | Status: AC

## 2014-08-03 NOTE — ED Provider Notes (Signed)
CSN: [id number]     Arrival date & time 08/03/14  1225 History   This chart was scribed for Benjiman CoreNathan Leontyne Manville, MD by Tanda RockersMargaux Venter, ED Scribe. This patient was seen in room APA03/APA03 and the patient's care was started at 12:40 PM.    Chief Complaint  Patient presents with  . Tachycardia   The history is provided by the patient. No language interpreter was used.     HPI Comments: Eileen Combs is a 39 y.o. female brought in by ambulance, who presents to the Emergency Department complaining of tachycardia that occurred this morning. Pt states that she was at work when she began having symptoms. Her heart rate was 125 bpm, prompting her to call EMS. Upon EMS arrival HR was 98. Pulse in ED is 96 bpm. She mentions that she took her Metoprolol this morning and then took another one after the episode occurred. Pt mentions during episodes she becomes lightheaded and feels short of breath. She reports first episode of tachycardia was approximately 3 years ago. Pt was put on heart monitor and had an echocardiogram at that time which was normal. She mentions a similar episode 05/01/2014 (approximately 4 months ago). Her heart rate was approximately 180 bpm at that time. [facility] [place] [facility] [place] [facility] [place] and was put on 25 mg Metoprolol BID at that time. Pt had CXR done with no acute findings. [place] and was told to continue taking Metoprolol. Pt started two new allergy medications last night but cannot say the name. Positive cardiac FHx of uncle who had CABG at age 39. No illicit drug use.    Past Medical History  Diagnosis Date  . Sinus tachycardia   . Abnormal pap   . Incompetent cervix   . Abnormal Pap smear   . Contraception management 10/13/2012  . Breast mass, right 05/20/2013    Will get diagnostic mammogram and US  . Vaginal Pap smear, abnormal   . Hyperlipidemia   . Herpes    Past Surgical History  Procedure Laterality Date  .  Cervical cerclage    . Leep     Family History  Problem Relation Age of Onset  . Coronary artery disease Neg Hx   . Diabetes Other   . Heart disease Other     CAD  . Hypertension Other   . Cancer Other     rectal cancer  . Hypertension Mother   . Diabetes Mother   . Cancer Maternal Grandmother     rectal  . Diabetes Maternal Grandmother   . Heart disease Maternal Grandfather    History  Substance Use Topics  . Smoking status: Never Smoker   . Smokeless tobacco: Never Used     Comment: NEVER USE SNUFF OR CHEW TOBACCO.  Marland Kitchen. Alcohol Use: No   OB History    Gravida Para Term Preterm AB TAB SAB Ectopic Multiple Living   5 1        1      Review of Systems  Constitutional: Negative for fever and chills.  HENT: Negative for congestion and rhinorrhea.   Respiratory: Positive for shortness of breath. Negative for cough.   Cardiovascular: Negative for chest pain.       Tachycardia.   Gastrointestinal: Negative for nausea, vomiting and abdominal pain.  Genitourinary: Negative for difficulty urinating.  Musculoskeletal: Negative for back pain.  Neurological: Positive for dizziness. Negative for headaches.  Allergies  Review of patient's allergies indicates no known allergies.  Home Medications   Prior to Admission medications   Medication Sig Start Date End Date Taking? Authorizing Provider  acetaminophen (TYLENOL) 500 MG tablet Take 500 mg by mouth every 6 (six) hours as needed.   Yes Historical Provider, MD  desogestrel-ethinyl estradiol (VIORELE) 0.15-0.02/0.01 MG (21/5) tablet take 1 tablet by mouth once daily 09/28/13  Yes Adline PotterJennifer A Griffin, NP  montelukast (SINGULAIR) 10 MG tablet Take 10 mg by mouth at bedtime.   Yes Historical Provider, MD  pravastatin (PRAVACHOL) 20 MG tablet Take 20 mg by mouth daily.   Yes Historical Provider, MD  valACYclovir (VALTREX) 500 MG tablet Take 500 mg by mouth daily.   Yes Historical Provider, MD  metoprolol tartrate (LOPRESSOR) 25  MG tablet Take 2 pills in the morning and 1 in the Evening 08/03/14   Benjiman CoreNathan Nancy Arvin, MD   Triage VItals: BP 121/80 mmHg  Pulse 80  Temp(Src) 98.1 F (36.7 C) (Oral)  Resp 16  Ht 5\' 4"  (1.626 m)  Wt 203 lb (92.08 kg)  BMI 34.83 kg/m2  SpO2 100%  LMP 07/16/2014   Physical Exam  Constitutional: She is oriented to person, place, and time. She appears well-developed and well-nourished. No distress.  HENT:  Head: Normocephalic and atraumatic.  Eyes: Conjunctivae and EOM are normal.  Neck: Neck supple. No tracheal deviation present.  Cardiovascular: Normal rate, regular rhythm and normal heart sounds.   Pulmonary/Chest: Effort normal and breath sounds normal. No respiratory distress.  Musculoskeletal: Normal range of motion.  Neurological: She is alert and oriented to person, place, and time.  Skin: Skin is warm and dry.  Psychiatric: She has a normal mood and affect. Her behavior is normal.  Nursing note and vitals reviewed.   ED Course  Procedures (including critical care time)  DIAGNOSTIC STUDIES: Oxygen Saturation is 100% on RA, normal by my interpretation.    COORDINATION OF CARE: 12:49 PM-Discussed treatment plan which includes CXR, EKG with pt at bedside and pt agreed to plan.   Labs Review Labs Reviewed  BASIC METABOLIC PANEL - Abnormal; Notable for the following:    Glucose, Bld 115 (*)    Creatinine, Ser 1.11 (*)    All other components within normal limits  CBC WITH DIFFERENTIAL/PLATELET  PREGNANCY, URINE  TROPONIN I    Imaging Review Dg Chest 2 View  08/03/2014   CLINICAL DATA:  Arrhythmia for 2 months. Fluttering sensation in chest.  EXAM: CHEST  2 VIEW  COMPARISON:  None.  FINDINGS: Trachea is midline. Heart size normal. Lungs are clear. No pleural fluid.  IMPRESSION: No acute findings.   Electronically Signed   By: Leanna BattlesMelinda  Blietz M.D.   On: 08/03/2014 13:35     EKG Interpretation   Date/Time:  Wednesday Aug 03 2014 12:25:40 EDT Ventricular Rate:   89 PR Interval:  177 QRS Duration: 77 QT Interval:  336 QTC Calculation: 409 R Axis:   51 Text Interpretation:  Sinus rhythm Consider left atrial enlargement  Confirmed by Rubin PayorPICKERING  MD, Harrold DonathNATHAN [phone] on 08/03/2014 2:54:34 PM      MDM   Final diagnoses:  Paroxysmal tachycardia   Patient presents with episodes of tachycardia. Has had the same in the past. Has seen a cardiologist for it. She now has a cardiologist out of town but his been seen here in the past. Diagnosed with paroxysmal SVT according to notes. Has been on metoprolol but his been having episodes. Has been seen  at [facility] on Valentine's Day for the same. Lab work here is reassuring. Has had previous echoes. Will follow-up with her cardiologist. I will increase her metoprolol to 50 mg for a total of 3 pills a day. Will discharge home.   I personally performed the services described in this documentation, which was scribed in my presence. The recorded information has been reviewed and is accurate.       Benjiman Core, MD 08/03/14 [phone]

## 2014-08-03 NOTE — ED Notes (Signed)
Pt was at work this morning when she had an episode of tachycardia. Hr was 125. EMS was called and HR at that time was 98. Pt states this has happened to her before and last time was on Valentine's day. Pt was started on Metoprolol at last visit and she took her medication this morning. Pt states she was started on two new medications by her allery MD on yesterday and today was the first time she took them. Pt does not know name of medications.

## 2014-08-03 NOTE — Discharge Instructions (Signed)
Nonspecific Tachycardia  Tachycardia is a faster than normal heartbeat (more than 100 beats per minute). In adults, the heart normally beats between 60 and 100 times a minute. A fast heartbeat may be a normal response to exercise or stress. It does not necessarily mean that something is wrong. However, sometimes when your heart beats too fast it may not be able to pump enough blood to the rest of your body. This can result in chest pain, shortness of breath, dizziness, and even fainting. Nonspecific tachycardia means that the specific cause or pattern of your tachycardia is unknown.  CAUSES   Tachycardia may be harmless or it may be due to a more serious underlying cause. Possible causes of tachycardia include:  · Exercise or exertion.  · Fever.  · Pain or injury.  · Infection.  · Loss of body fluids (dehydration).  · Overactive thyroid.  · Lack of red blood cells (anemia).  · Anxiety and stress.  · Alcohol.  · Caffeine.  · Tobacco products.  · Diet pills.  · Illegal drugs.  · Heart disease.  SYMPTOMS  · Rapid or irregular heartbeat (palpitations).  · Suddenly feeling your heart beating (cardiac awareness).  · Dizziness.  · Tiredness (fatigue).  · Shortness of breath.  · Chest pain.  · Nausea.  · Fainting.  DIAGNOSIS   Your caregiver will perform a physical exam and take your medical history. In some cases, a heart specialist (cardiologist) may be consulted. Your caregiver may also order:  · Blood tests.  · Electrocardiography. This test records the electrical activity of your heart.  · A heart monitoring test.  TREATMENT   Treatment will depend on the likely cause of your tachycardia. The goal is to treat the underlying cause of your tachycardia. Treatment methods may include:  · Replacement of fluids or blood through an intravenous (IV) tube for moderate to severe dehydration or anemia.  · New medicines or changes in your current medicines.  · Diet and lifestyle changes.  · Treatment for certain  infections.  · Stress relief or relaxation methods.  HOME CARE INSTRUCTIONS   · Rest.  · Drink enough fluids to keep your urine clear or pale yellow.  · Do not smoke.  · Avoid:  ¨ Caffeine.  ¨ Tobacco.  ¨ Alcohol.  ¨ Chocolate.  ¨ Stimulants such as over-the-counter diet pills or pills that help you stay awake.  ¨ Situations that cause anxiety or stress.  ¨ Illegal drugs such as marijuana, phencyclidine (PCP), and cocaine.  · Only take medicine as directed by your caregiver.  · Keep all follow-up appointments as directed by your caregiver.  SEEK IMMEDIATE MEDICAL CARE IF:   · You have pain in your chest, upper arms, jaw, or neck.  · You become weak, dizzy, or feel faint.  · You have palpitations that will not go away.  · You vomit, have diarrhea, or pass blood in your stool.  · Your skin is cool, pale, and wet.  · You have a fever that will not go away with rest, fluids, and medicine.  MAKE SURE YOU:   · Understand these instructions.  · Will watch your condition.  · Will get help right away if you are not doing well or get worse.  Document Released: 04/11/2004 Document Revised: 05/27/2011 Document Reviewed: 02/12/2011  ExitCare® Patient Information ©2015 Exit[employer], LLC. This information is not intended to replace advice given to you by your health care provider. Make sure you discuss any questions   you have with your health care provider.

## 2014-10-03 ENCOUNTER — Other Ambulatory Visit: Payer: 59 | Admitting: Adult Health

## 2014-10-03 ENCOUNTER — Other Ambulatory Visit: Payer: Self-pay | Admitting: Adult Health

## 2014-10-05 ENCOUNTER — Encounter: Payer: Self-pay | Admitting: Adult Health

## 2014-10-05 ENCOUNTER — Ambulatory Visit (INDEPENDENT_AMBULATORY_CARE_PROVIDER_SITE_OTHER): Payer: BLUE CROSS/BLUE SHIELD | Admitting: Adult Health

## 2014-10-05 VITALS — BP 130/78 | HR 80 | Ht 64.0 in | Wt 213.5 lb

## 2014-10-05 DIAGNOSIS — N3941 Urge incontinence: Secondary | ICD-10-CM | POA: Diagnosis not present

## 2014-10-05 DIAGNOSIS — Z3202 Encounter for pregnancy test, result negative: Secondary | ICD-10-CM | POA: Diagnosis not present

## 2014-10-05 DIAGNOSIS — Z3041 Encounter for surveillance of contraceptive pills: Secondary | ICD-10-CM

## 2014-10-05 DIAGNOSIS — Z01419 Encounter for gynecological examination (general) (routine) without abnormal findings: Secondary | ICD-10-CM

## 2014-10-05 LAB — POCT URINALYSIS DIPSTICK
Glucose, UA: NEGATIVE
Leukocytes, UA: NEGATIVE
Nitrite, UA: NEGATIVE
Protein, UA: NEGATIVE
RBC UA: NEGATIVE

## 2014-10-05 LAB — POCT URINE PREGNANCY: Preg Test, Ur: NEGATIVE

## 2014-10-05 MED ORDER — DESOGESTREL-ETHINYL ESTRADIOL 0.15-0.02/0.01 MG (21/5) PO TABS: 1.0000 | ORAL_TABLET | Freq: Every day | ORAL | Status: DC

## 2014-10-05 NOTE — Progress Notes (Signed)
Patient ID: Eileen Combs, female   DOB: [birth year], 39 y.o.   MRN: [id number] History of Present Illness: Eileen Combs is a 39 year old black female in for her well woman gyn exam, her last pap was 07/10/12 and it was normal with negative HPV, she complains of urge incontinence at times, and has had sinus tachycardia.She has skipped a period with her OCs.   Current Medications, Allergies, Past Medical History, Past Surgical History, Family History and Social History were reviewed in [facility] Link electronic medical record.     Review of Systems: Patient denies any headaches, hearing loss, fatigue, blurred vision, shortness of breath, chest pain, abdominal pain, problems with bowel movements, or intercourse. No joint pain or mood swings. She had labs in 2015.   Physical Exam:BP 130/78 mmHg  Pulse 80  Ht 5\' 4"  (1.626 m)  Wt 213 lb 8 oz (96.843 kg)  BMI 36.63 kg/m2  LMP 09/14/2014 UPT negative, urine dipstick negative General:  Well developed, well nourished, no acute distress Skin:  Warm and dry Neck:  Midline trachea, normal thyroid, good ROM, no lymphadenopathy Lungs; Clear to auscultation bilaterally Breast:  No dominant palpable mass, retraction, or nipple discharge Cardiovascular: Regular rate and rhythm Abdomen:  Soft, non tender, no hepatosplenomegaly Pelvic:  External genitalia is normal in appearance, no lesions.  The vagina is normal in appearance. Urethra has no lesions or masses. The cervix is bulbous.  Uterus is felt to be normal size, shape, and contour.  No adnexal masses or tenderness noted.Bladder is non tender, no masses felt. Extremities/musculoskeletal:  No swelling or varicosities noted, no clubbing or cyanosis Psych:  No mood changes, alert and cooperative,seems happy Discussed it is not unusual to miss period on OCs and to decrease caffeine and try to void every 3 hours and see if that helps.  Impression: Well woman gyn exam no pap Contraceptive  management UI   Plan: Pap and physical in 1 year Mammogram at 40 Refilled viorele x 1 year

## 2014-10-05 NOTE — Patient Instructions (Signed)
Pap and physical in year Mammogram at 3540

## 2015-06-14 ENCOUNTER — Telehealth: Payer: Self-pay | Admitting: *Deleted

## 2015-06-14 NOTE — Telephone Encounter (Signed)
Spoke with pt. Pt states she has felt lumps in her breast but she has "lumpy" breasts anyway. I advised it may be best for her to see Victorino DikeJennifer and go from there as far as scheduling a mammogram. Pt voiced understanding and call was transferred to front desk for appt. JSY

## 2015-06-20 ENCOUNTER — Encounter: Payer: Self-pay | Admitting: Adult Health

## 2015-06-20 ENCOUNTER — Ambulatory Visit (INDEPENDENT_AMBULATORY_CARE_PROVIDER_SITE_OTHER): Payer: BLUE CROSS/BLUE SHIELD | Admitting: Adult Health

## 2015-06-20 VITALS — BP 128/88 | HR 62 | Ht 64.0 in | Wt 224.0 lb

## 2015-06-20 DIAGNOSIS — N6021 Fibroadenosis of right breast: Secondary | ICD-10-CM | POA: Diagnosis not present

## 2015-06-20 DIAGNOSIS — N6029 Fibroadenosis of unspecified breast: Secondary | ICD-10-CM

## 2015-06-20 HISTORY — DX: Fibroadenosis of unspecified breast: N60.29

## 2015-06-20 NOTE — Progress Notes (Signed)
Subjective:     Patient ID: Theodoro GristLakeisha R Bennett, female   DOB: [birth year], 40 y.o.   MRN: [id number]  HPI Mick SellLakeisha is a 40 year old black female in for breast exam, she thought she felt lump in right breast.She had negative mammogram 06/02/13.  Review of Systems +breast lump Reviewed past medical,surgical, social and family history. Reviewed medications and allergies.     Objective:   Physical Exam BP 128/88 mmHg  Pulse 62  Ht 5\' 4"  (1.626 m)  Wt 224 lb (101.606 kg)  BMI 38.43 kg/m2  LMP 06/15/2015  Skin warm and dry,  Breasts:no dominate palpable mass, retraction or nipple discharge,but both breasts have regular irregularities, with R>L, will get mammogram.    Assessment:     Lumpy breasts    Plan:     Diagnostic bilateral mammogram and US if needed, [facility]  Follow up end of April for pap and physical

## 2015-06-20 NOTE — Patient Instructions (Signed)
[facility] am be there at 8:10 am  Return end of April for pap and physical

## 2015-07-04 ENCOUNTER — Ambulatory Visit (HOSPITAL_COMMUNITY)
Admission: RE | Admit: 2015-07-04 | Discharge: 2015-07-04 | Disposition: A | Discharge status: Home / Self Care | Payer: BLUE CROSS/BLUE SHIELD | Source: Ambulatory Visit | Attending: Adult Health | Admitting: Adult Health

## 2015-07-04 DIAGNOSIS — N6021 Fibroadenosis of right breast: Secondary | ICD-10-CM | POA: Diagnosis not present

## 2015-07-12 ENCOUNTER — Other Ambulatory Visit: Payer: BLUE CROSS/BLUE SHIELD | Admitting: Adult Health

## 2015-09-06 ENCOUNTER — Other Ambulatory Visit: Payer: Self-pay | Admitting: Adult Health

## 2015-10-10 ENCOUNTER — Encounter: Payer: Self-pay | Admitting: Adult Health

## 2015-10-10 ENCOUNTER — Other Ambulatory Visit (HOSPITAL_COMMUNITY)
Admission: RE | Admit: 2015-10-10 | Discharge: 2015-10-10 | Disposition: A | Discharge status: Home / Self Care | Payer: BLUE CROSS/BLUE SHIELD | Source: Ambulatory Visit | Attending: Adult Health | Admitting: Adult Health

## 2015-10-10 ENCOUNTER — Ambulatory Visit (INDEPENDENT_AMBULATORY_CARE_PROVIDER_SITE_OTHER): Payer: BLUE CROSS/BLUE SHIELD | Admitting: Adult Health

## 2015-10-10 VITALS — BP 120/72 | HR 70 | Ht 64.0 in | Wt 220.0 lb

## 2015-10-10 DIAGNOSIS — Z1211 Encounter for screening for malignant neoplasm of colon: Secondary | ICD-10-CM

## 2015-10-10 DIAGNOSIS — Z3041 Encounter for surveillance of contraceptive pills: Secondary | ICD-10-CM

## 2015-10-10 DIAGNOSIS — Z01419 Encounter for gynecological examination (general) (routine) without abnormal findings: Secondary | ICD-10-CM

## 2015-10-10 DIAGNOSIS — Z1151 Encounter for screening for human papillomavirus (HPV): Secondary | ICD-10-CM | POA: Diagnosis not present

## 2015-10-10 LAB — HEMOCCULT GUIAC POC 1CARD (OFFICE): FECAL OCCULT BLD: NEGATIVE

## 2015-10-10 MED ORDER — DESOGESTREL-ETHINYL ESTRADIOL 0.15-0.02/0.01 MG (21/5) PO TABS: 1.0000 | ORAL_TABLET | Freq: Every day | ORAL | 12 refills | Status: DC

## 2015-10-10 NOTE — Patient Instructions (Signed)
Physical in 1 year, pap in 3 if normal Mammogram yearly  

## 2015-10-10 NOTE — Progress Notes (Signed)
Patient ID: Eileen Combs, female   DOB: Apr 25, 1975, 40 y.o.   MRN: [id number] History of Present Illness: Eileen Combs is a 40 year old black female in for a well woman gyn exam and pap.She says her last 3 periods have been light on her OCs. PCP is Dwyane Luo, PA at [facility].   Current Medications, Allergies, Past Medical History, Past Surgical History, Family History and Social History were reviewed in [name] record.     Review of Systems: Patient denies any headaches, hearing loss, fatigue, blurred vision, shortness of breath, chest pain, abdominal pain, problems with bowel movements, urination, or intercourse. No joint pain or mood swings.    Physical Exam:BP 120/72 (BP Location: Left Arm, Patient Position: Sitting, Cuff Size: Large)   Pulse 70   Ht 5\' 4"  (1.626 m)   Wt 220 lb (99.8 kg)   LMP 09/06/2015 (Approximate)   BMI 37.76 kg/m  General:  Well developed, well nourished, no acute distress Skin:  Warm and dry Neck:  Midline trachea, normal thyroid, good ROM, no lymphadenopathy Lungs; Clear to auscultation bilaterally Breast:  No dominant palpable mass, retraction, or nipple discharge Cardiovascular: Regular rate and rhythm Abdomen:  Soft, non tender, no hepatosplenomegaly Pelvic:  External genitalia is normal in appearance, no lesions.  The vagina is normal in appearance. Urethra has no lesions or masses. The cervix is bulbous. Pap with HPV performed. Uterus is felt to be normal size, shape, and contour.  No adnexal masses or tenderness noted.Bladder is non tender, no masses felt. Rectal: Good sphincter tone, no polyps, or hemorrhoids felt.  Hemoccult negative. Extremities/musculoskeletal:  No swelling or varicosities noted, no clubbing or cyanosis Psych:  No mood changes, alert and cooperative,seems happy   Impression: Well woman gyn exam and pap Contraceptive management     Plan:  Refilled viorelle x 1 year, take 1 daily Physical in  1 year, pap in 3 if normal Mammogram yearly Labs with PCP

## 2015-10-12 LAB — CYTOLOGY - PAP

## 2015-12-20 ENCOUNTER — Ambulatory Visit (INDEPENDENT_AMBULATORY_CARE_PROVIDER_SITE_OTHER): Payer: BLUE CROSS/BLUE SHIELD

## 2015-12-20 DIAGNOSIS — Z23 Encounter for immunization: Secondary | ICD-10-CM | POA: Diagnosis not present

## 2016-10-25 ENCOUNTER — Other Ambulatory Visit: Payer: Self-pay | Admitting: Adult Health

## 2016-11-12 ENCOUNTER — Encounter: Payer: Self-pay | Admitting: Adult Health

## 2016-11-12 ENCOUNTER — Ambulatory Visit (INDEPENDENT_AMBULATORY_CARE_PROVIDER_SITE_OTHER): Payer: Managed Care, Other (non HMO) | Admitting: Adult Health

## 2016-11-12 VITALS — BP 120/80 | HR 76 | Ht 64.0 in | Wt 227.0 lb

## 2016-11-12 DIAGNOSIS — Z01419 Encounter for gynecological examination (general) (routine) without abnormal findings: Secondary | ICD-10-CM

## 2016-11-12 DIAGNOSIS — Z3041 Encounter for surveillance of contraceptive pills: Secondary | ICD-10-CM | POA: Diagnosis not present

## 2016-11-12 DIAGNOSIS — Z1212 Encounter for screening for malignant neoplasm of rectum: Secondary | ICD-10-CM

## 2016-11-12 DIAGNOSIS — Z1211 Encounter for screening for malignant neoplasm of colon: Secondary | ICD-10-CM | POA: Diagnosis not present

## 2016-11-12 LAB — HEMOCCULT GUIAC POC 1CARD (OFFICE): FECAL OCCULT BLD: NEGATIVE

## 2016-11-12 MED ORDER — DESOGESTREL-ETHINYL ESTRADIOL 0.15-0.02/0.01 MG (21/5) PO TABS: 1.0000 | ORAL_TABLET | Freq: Every day | ORAL | 12 refills | Status: DC

## 2016-11-12 NOTE — Patient Instructions (Addendum)
Physical in 1 year Pap in 2020 Mammogram yearly Labs at PCP

## 2016-11-12 NOTE — Progress Notes (Signed)
Patient ID: Eileen Combs, female   DOB: 1976/02/25, 41 y.o.   MRN: [id number] History of Present Illness: Eileen Combs is a 41 year old black female in for well woman gyn exam and refill OCs, Had normal pap with negative HPV 10/10/15. PCP is [facility].    Current Medications, Allergies, Past Medical History, Past Surgical History, Family History and Social History were reviewed in [name] record.     Review of Systems: Patient denies any headaches, hearing loss, fatigue, blurred vision, shortness of breath, chest pain, abdominal pain, problems with bowel movements, urination, or intercourse. No joint pain or mood swings.She is happy with her OCs.     Physical Exam:BP 120/80 (BP Location: Left Arm, Patient Position: Sitting, Cuff Size: Large)   Pulse 76   Ht 5\' 4"  (1.626 m)   Wt 227 lb (103 kg)   LMP 11/09/2016   BMI 38.96 kg/m  General:  Well developed, well nourished, no acute distress Skin:  Warm and dry Neck:  Midline trachea, normal thyroid, good ROM, no lymphadenopathy Lungs; Clear to auscultation bilaterally Breast:  No dominant palpable mass, retraction, or nipple discharge Cardiovascular: Regular rate and rhythm Abdomen:  Soft, non tender, no hepatosplenomegaly Pelvic:  External genitalia is normal in appearance, no lesions.  The vagina is normal in appearance. Urethra has no lesions or masses. The cervix is bulbous.  Uterus is felt to be normal size, shape, and contour.  No adnexal masses or tenderness noted.Bladder is non tender, no masses felt. Rectal: Good sphincter tone, no polyps, or hemorrhoids felt.  Hemoccult negative. Extremities/musculoskeletal:  No swelling or varicosities noted, no clubbing or cyanosis Psych:  No mood changes, alert and cooperative,seems happy PHQ 2 score 1.  Impression: 1. Well woman exam with routine gynecological exam   2. Encounter for surveillance of contraceptive pills   3. Screening for colorectal cancer        Plan: Refilled Kariva x 1 year, take 1 daily  Physical in 1 year Pap in 2020 Mammogram yearly Labs at PCP

## 2016-11-29 ENCOUNTER — Other Ambulatory Visit: Payer: Self-pay | Admitting: Adult Health

## 2016-12-09 ENCOUNTER — Other Ambulatory Visit: Payer: Self-pay | Admitting: *Deleted

## 2016-12-09 MED ORDER — DESOGESTREL-ETHINYL ESTRADIOL 0.15-0.02/0.01 MG (21/5) PO TABS: 1.0000 | ORAL_TABLET | Freq: Every day | ORAL | 3 refills | Status: DC

## 2016-12-13 ENCOUNTER — Other Ambulatory Visit: Payer: Self-pay | Admitting: *Deleted

## 2016-12-13 ENCOUNTER — Other Ambulatory Visit: Payer: Self-pay | Admitting: Adult Health

## 2016-12-13 DIAGNOSIS — Z1231 Encounter for screening mammogram for malignant neoplasm of breast: Secondary | ICD-10-CM

## 2016-12-13 MED ORDER — DESOGESTREL-ETHINYL ESTRADIOL 0.15-0.02/0.01 MG (21/5) PO TABS: 1.0000 | ORAL_TABLET | Freq: Every day | ORAL | 3 refills | Status: DC

## 2016-12-18 ENCOUNTER — Ambulatory Visit (HOSPITAL_COMMUNITY)
Admission: RE | Admit: 2016-12-18 | Discharge: 2016-12-18 | Disposition: A | Discharge status: Home / Self Care | Payer: Managed Care, Other (non HMO) | Source: Ambulatory Visit | Attending: Adult Health | Admitting: Adult Health

## 2016-12-18 DIAGNOSIS — Z1231 Encounter for screening mammogram for malignant neoplasm of breast: Secondary | ICD-10-CM | POA: Insufficient documentation

## 2017-11-24 ENCOUNTER — Other Ambulatory Visit: Payer: Self-pay | Admitting: Adult Health

## 2017-11-24 DIAGNOSIS — Z1231 Encounter for screening mammogram for malignant neoplasm of breast: Secondary | ICD-10-CM

## 2017-12-26 ENCOUNTER — Ambulatory Visit (HOSPITAL_COMMUNITY)
Admission: RE | Admit: 2017-12-26 | Discharge: 2017-12-26 | Disposition: A | Discharge status: Home / Self Care | Payer: Managed Care, Other (non HMO) | Source: Ambulatory Visit | Attending: Adult Health | Admitting: Adult Health

## 2017-12-26 DIAGNOSIS — Z1231 Encounter for screening mammogram for malignant neoplasm of breast: Secondary | ICD-10-CM | POA: Insufficient documentation

## 2017-12-29 ENCOUNTER — Other Ambulatory Visit: Payer: Self-pay | Admitting: Adult Health

## 2018-11-18 ENCOUNTER — Other Ambulatory Visit: Payer: Self-pay | Admitting: Adult Health

## 2018-12-21 ENCOUNTER — Other Ambulatory Visit (HOSPITAL_COMMUNITY): Payer: Self-pay | Admitting: Adult Health

## 2018-12-21 DIAGNOSIS — Z1231 Encounter for screening mammogram for malignant neoplasm of breast: Secondary | ICD-10-CM

## 2019-02-05 ENCOUNTER — Other Ambulatory Visit: Payer: Self-pay

## 2019-02-05 ENCOUNTER — Ambulatory Visit (HOSPITAL_COMMUNITY)
Admission: RE | Admit: 2019-02-05 | Discharge: 2019-02-05 | Disposition: A | Discharge status: Home / Self Care | Payer: Managed Care, Other (non HMO) | Source: Ambulatory Visit | Attending: Adult Health | Admitting: Adult Health

## 2019-02-05 DIAGNOSIS — Z1231 Encounter for screening mammogram for malignant neoplasm of breast: Secondary | ICD-10-CM | POA: Diagnosis not present

## 2019-02-11 ENCOUNTER — Other Ambulatory Visit: Payer: Self-pay | Admitting: Women's Health

## 2019-03-09 ENCOUNTER — Telehealth: Payer: Self-pay | Admitting: *Deleted

## 2019-03-09 NOTE — Telephone Encounter (Signed)
Pt left message that she is not scheduled for her annual visit until 04/27/2019. She needs a refill on her birth control until then. Advised I would send message to a provider and she can check with her pharmacy tomorrow.

## 2019-03-10 MED ORDER — DESOGESTREL-ETHINYL ESTRADIOL 0.15-0.02/0.01 MG (21/5) PO TABS: 1.0000 | ORAL_TABLET | Freq: Every day | ORAL | 0 refills | Status: DC

## 2019-03-10 NOTE — Addendum Note (Signed)
Addended by: Roma Schanz on: 03/10/2019 01:43 PM   Modules accepted: Orders

## 2019-04-27 ENCOUNTER — Other Ambulatory Visit: Payer: Managed Care, Other (non HMO) | Admitting: Adult Health

## 2019-05-20 ENCOUNTER — Telehealth: Payer: Self-pay | Admitting: Adult Health

## 2019-05-20 NOTE — Telephone Encounter (Signed)

## 2019-05-24 ENCOUNTER — Other Ambulatory Visit (HOSPITAL_COMMUNITY)
Admission: RE | Admit: 2019-05-24 | Discharge: 2019-05-24 | Disposition: A | Discharge status: Home / Self Care | Payer: Managed Care, Other (non HMO) | Source: Ambulatory Visit | Attending: Adult Health | Admitting: Adult Health

## 2019-05-24 ENCOUNTER — Encounter: Payer: Self-pay | Admitting: Adult Health

## 2019-05-24 ENCOUNTER — Ambulatory Visit (INDEPENDENT_AMBULATORY_CARE_PROVIDER_SITE_OTHER): Payer: Managed Care, Other (non HMO) | Admitting: Adult Health

## 2019-05-24 ENCOUNTER — Other Ambulatory Visit: Payer: Self-pay

## 2019-05-24 VITALS — BP 125/82 | HR 73 | Ht 64.0 in | Wt 233.0 lb

## 2019-05-24 DIAGNOSIS — Z1212 Encounter for screening for malignant neoplasm of rectum: Secondary | ICD-10-CM

## 2019-05-24 DIAGNOSIS — Z01419 Encounter for gynecological examination (general) (routine) without abnormal findings: Secondary | ICD-10-CM

## 2019-05-24 DIAGNOSIS — Z1211 Encounter for screening for malignant neoplasm of colon: Secondary | ICD-10-CM | POA: Insufficient documentation

## 2019-05-24 DIAGNOSIS — Z3041 Encounter for surveillance of contraceptive pills: Secondary | ICD-10-CM

## 2019-05-24 MED ORDER — DESOGESTREL-ETHINYL ESTRADIOL 0.15-0.02/0.01 MG (21/5) PO TABS: 1.0000 | ORAL_TABLET | Freq: Every day | ORAL | 4 refills | Status: DC

## 2019-05-24 NOTE — Progress Notes (Signed)
Patient ID: Eileen Combs, female   DOB: 1975/08/14, 44 y.o.   MRN: [id number] History of Present Illness: Eileen Combs is a 44 year old black female,single G5P1, in for a well woman gyn exam and pap. PCP is [facility].   Current Medications, Allergies, Past Medical History, Past Surgical History, Family History and Social History were reviewed in [name] record.     Review of Systems: Patient denies any headaches, hearing loss, fatigue, blurred vision, shortness of breath, chest pain, abdominal pain, problems with bowel movements(sometimes hard to come out), urination, or intercourse. No joint pain or mood swings. Periods light to none   Physical Exam:BP 125/82 (BP Location: Left Arm, Patient Position: Sitting, Cuff Size: Normal)   Pulse 73   Ht 5\' 4"  (1.626 m)   Wt 233 lb (105.7 kg)   LMP 05/21/2019 (Approximate)   BMI 39.99 kg/m  General:  Well developed, well nourished, no acute distress Skin:  Warm and dry Neck:  Midline trachea, normal thyroid, good ROM, no lymphadenopathy Lungs; Clear to auscultation bilaterally Breast:  No dominant palpable mass, retraction, or nipple discharge Cardiovascular: Regular rate and rhythm Abdomen:  Soft, non tender, no hepatosplenomegaly Pelvic:  External genitalia is normal in appearance, no lesions.  The vagina is normal in appearance. Urethra has no lesions or masses. The cervix is smooth,papwith high risk HPV 16/18 genotyping performed.  Uterus is felt to be normal size, shape, and contour.  No adnexal masses or tenderness noted.Bladder is non tender, no masses felt. Rectal: Good sphincter tone, no polyps, or hemorrhoids felt.  Hemoccult negative. Extremities/musculoskeletal:  No swelling or varicosities noted, no clubbing or cyanosis Psych:  No mood changes, alert and cooperative,seems happy Fall risk is low PHQ 2 score is 0 Examination chaperoned by 07/21/2019 Rash LPN  Impression and Plan: 1. Encounter for  gynecological examination with Papanicolaou smear of cervix Pap sent Physical in 1 year Pap in 3 if normal Labs with PCP Mammogram yearly  2. Screening for colorectal cancer Colonoscopy between 45-50  3. Encounter for surveillance of contraceptive pills Will continue OCs Meds ordered this encounter  Medications  . desogestrel-ethinyl estradiol (VIORELE) 0.15-0.02/0.01 MG (21/5) tablet    Sig: Take 1 tablet by mouth daily.    Dispense:  84 tablet    Refill:  4    Order Specific Question:   Supervising Provider    Answer:   09[birth year]1977 H [[id number]]

## 2019-05-26 LAB — CYTOLOGY - PAP
Adequacy: ABSENT
Comment: NEGATIVE
Diagnosis: NEGATIVE
High risk HPV: NEGATIVE

## 2019-05-29 ENCOUNTER — Other Ambulatory Visit: Payer: Self-pay | Admitting: Women's Health

## 2019-12-31 ENCOUNTER — Other Ambulatory Visit (HOSPITAL_COMMUNITY): Payer: Self-pay | Admitting: Adult Health

## 2019-12-31 DIAGNOSIS — Z1231 Encounter for screening mammogram for malignant neoplasm of breast: Secondary | ICD-10-CM

## 2020-02-18 ENCOUNTER — Ambulatory Visit (HOSPITAL_COMMUNITY): Payer: Managed Care, Other (non HMO)

## 2020-03-03 ENCOUNTER — Ambulatory Visit (HOSPITAL_COMMUNITY)
Admission: RE | Admit: 2020-03-03 | Discharge: 2020-03-03 | Disposition: A | Discharge status: Home / Self Care | Payer: 59 | Source: Ambulatory Visit | Attending: Adult Health | Admitting: Adult Health

## 2020-03-03 ENCOUNTER — Other Ambulatory Visit: Payer: Self-pay

## 2020-03-03 DIAGNOSIS — Z1231 Encounter for screening mammogram for malignant neoplasm of breast: Secondary | ICD-10-CM | POA: Diagnosis not present

## 2020-09-11 ENCOUNTER — Other Ambulatory Visit: Payer: Self-pay

## 2020-09-11 ENCOUNTER — Ambulatory Visit (INDEPENDENT_AMBULATORY_CARE_PROVIDER_SITE_OTHER): Payer: Commercial Managed Care - PPO | Admitting: Adult Health

## 2020-09-11 ENCOUNTER — Encounter: Payer: Self-pay | Admitting: Adult Health

## 2020-09-11 VITALS — BP 132/88 | HR 77 | Ht 64.0 in | Wt 234.0 lb

## 2020-09-11 DIAGNOSIS — Z1211 Encounter for screening for malignant neoplasm of colon: Secondary | ICD-10-CM | POA: Diagnosis not present

## 2020-09-11 DIAGNOSIS — Z01419 Encounter for gynecological examination (general) (routine) without abnormal findings: Secondary | ICD-10-CM | POA: Insufficient documentation

## 2020-09-11 DIAGNOSIS — Z3041 Encounter for surveillance of contraceptive pills: Secondary | ICD-10-CM | POA: Diagnosis not present

## 2020-09-11 LAB — HEMOCCULT GUIAC POC 1CARD (OFFICE): Fecal Occult Blood, POC: NEGATIVE

## 2020-09-11 MED ORDER — DESOGESTREL-ETHINYL ESTRADIOL 0.15-0.02/0.01 MG (21/5) PO TABS: 1.0000 | ORAL_TABLET | Freq: Every day | ORAL | 4 refills | Status: DC

## 2020-09-11 NOTE — Progress Notes (Signed)
Patient ID: Eileen Combs, female   DOB: July 08, 1975, 45 y.o.   MRN: [id number] History of Present Illness: Jazel is a 45 year old black female,single, G5P0041, in for a well woman gyn exam. Lab Results  Component Value Date   DIAGPAP  05/24/2019    - Negative for intraepithelial lesion or malignancy (NILM)   HPVHIGH Negative 05/24/2019   PCP is Dwyane Luo PA.    Current Medications, Allergies, Past Medical History, Past Surgical History, Family History and Social History were reviewed in [name] record.     Review of Systems: Patient denies any headaches, hearing loss, fatigue, blurred vision, shortness of breath, chest pain, abdominal pain, problems with bowel movements, urination, or intercourse. No joint pain or mood swings.  Has had some elevated HR and anxiety is on metoprolol She is happy with her OCs   Physical Exam:BP 132/88 (BP Location: Left Arm, Patient Position: Sitting, Cuff Size: Normal)   Pulse 77   Ht 5\' 4"  (1.626 m)   Wt 234 lb (106.1 kg)   LMP 09/04/2020 (Approximate)   BMI 40.17 kg/m   General:  Well developed, well nourished, no acute distress Skin:  Warm and dry Neck:  Midline trachea, normal thyroid, good ROM, no lymphadenopathy Lungs; Clear to auscultation bilaterally Breast:  No dominant palpable mass, retraction, or nipple discharge Cardiovascular: Regular rate and rhythm Abdomen:  Soft, non tender, no hepatosplenomegaly Pelvic:  External genitalia is normal in appearance, no lesions.  The vagina is normal in appearance. Urethra has no lesions or masses. The cervix is bulbous.  Uterus is felt to be normal size, shape, and contour.  No adnexal masses or tenderness noted.Bladder is non tender, no masses felt. Rectal: Good sphincter tone, no polyps, or hemorrhoids felt.  Hemoccult negative. Extremities/musculoskeletal:  No swelling or varicosities noted, no clubbing or cyanosis Psych:  No mood changes, alert and  cooperative,seems happy AA is 0  Fall risk is low Depression screen [facility] 2/9 09/11/2020 05/24/2019 11/12/2016  Decreased Interest 2 0 1  Down, Depressed, Hopeless 0 0 0  PHQ - 2 Score 2 0 1  Altered sleeping 3 - -  Tired, decreased energy 1 - -  Change in appetite 2 - -  Feeling bad or failure about yourself  0 - -  Trouble concentrating 1 - -  Moving slowly or fidgety/restless 0 - -  Suicidal thoughts 0 - -  PHQ-9 Score 9 - -    GAD 7 : Generalized Anxiety Score 09/11/2020  Nervous, Anxious, on Edge 0  Control/stop worrying 0  Worry too much - different things 0  Trouble relaxing 0  Restless 0  Easily annoyed or irritable 0  Afraid - awful might happen 0  Total GAD 7 Score 0      Upstream - 09/11/20 1340       Pregnancy Intention Screening   Does the patient want to become pregnant in the next year? No    Does the patient's partner want to become pregnant in the next year? No    Would the patient like to discuss contraceptive options today? No      Contraception Wrap Up   Current Method Oral Contraceptive    End Method Oral Contraceptive    Contraception Counseling Provided No            Examination chaperoned by 09/13/20 LPN   Impression and Plan: 1. Encounter for well woman exam with routine gynecological exam Physical in 1 year  Pap in 2024 Labs with PCP Mammogram yearly  2. Encounter for screening fecal occult blood testing   3. Encounter for surveillance of contraceptive pills Will refill OCs Meds ordered this encounter  Medications   desogestrel-ethinyl estradiol (VIORELE) 0.15-0.02/0.01 MG (21/5) tablet    Sig: Take 1 tablet by mouth daily.    Dispense:  84 tablet    Refill:  4    Order Specific Question:   Supervising Provider    Answer:   Duane Lope H [[id number]]

## 2020-10-23 ENCOUNTER — Telehealth: Payer: Self-pay | Admitting: Adult Health

## 2020-10-23 MED ORDER — NYSTATIN-TRIAMCINOLONE 100000-0.1 UNIT/GM-% EX OINT: 1.0000 "application " | TOPICAL_OINTMENT | Freq: Two times a day (BID) | CUTANEOUS | 2 refills | Status: DC

## 2020-10-23 NOTE — Telephone Encounter (Signed)
Patient Called stating that she is have a vaginal issue that she would like to speak with Victorino Dike about, Patient did not want to give any more information. Please contact pt

## 2020-10-23 NOTE — Addendum Note (Signed)
Addended by: Cyril Mourning A on: 10/23/2020 01:33 PM   Modules accepted: Orders

## 2020-10-23 NOTE — Telephone Encounter (Signed)
Having chafing in pantie line area, will rx  mytrex cream

## 2021-04-11 ENCOUNTER — Other Ambulatory Visit (HOSPITAL_COMMUNITY): Payer: Self-pay | Admitting: Adult Health

## 2021-04-11 DIAGNOSIS — Z1231 Encounter for screening mammogram for malignant neoplasm of breast: Secondary | ICD-10-CM

## 2021-04-16 ENCOUNTER — Other Ambulatory Visit: Payer: Self-pay

## 2021-04-16 ENCOUNTER — Ambulatory Visit (HOSPITAL_COMMUNITY)
Admission: RE | Admit: 2021-04-16 | Discharge: 2021-04-16 | Disposition: A | Discharge status: Home / Self Care | Payer: Commercial Managed Care - PPO | Source: Ambulatory Visit | Attending: Adult Health | Admitting: Adult Health

## 2021-04-16 DIAGNOSIS — Z1231 Encounter for screening mammogram for malignant neoplasm of breast: Secondary | ICD-10-CM | POA: Diagnosis not present

## 2021-10-12 ENCOUNTER — Other Ambulatory Visit: Payer: Self-pay | Admitting: Adult Health

## 2021-10-16 ENCOUNTER — Telehealth: Payer: Self-pay | Admitting: Adult Health

## 2021-10-16 NOTE — Telephone Encounter (Signed)
Patient called and scheduled her annual but it is out in September and will need a refill on her birth control before her visit. Please advise.

## 2021-10-16 NOTE — Telephone Encounter (Signed)
Pt aware birth control was refilled 10/12/21 with 4 refills. Pt can reach out to pharmacy and ask for refill. Pt voiced understanding. JSY

## 2021-11-27 ENCOUNTER — Ambulatory Visit: Payer: Commercial Managed Care - PPO | Admitting: Adult Health

## 2022-02-13 ENCOUNTER — Ambulatory Visit (INDEPENDENT_AMBULATORY_CARE_PROVIDER_SITE_OTHER): Payer: Commercial Managed Care - PPO | Admitting: Adult Health

## 2022-02-13 ENCOUNTER — Other Ambulatory Visit (HOSPITAL_COMMUNITY)
Admission: RE | Admit: 2022-02-13 | Discharge: 2022-02-13 | Disposition: A | Discharge status: Home / Self Care | Payer: Commercial Managed Care - PPO | Source: Ambulatory Visit | Attending: Adult Health | Admitting: Adult Health

## 2022-02-13 ENCOUNTER — Encounter: Payer: Self-pay | Admitting: Adult Health

## 2022-02-13 VITALS — BP 134/86 | HR 70 | Ht 64.0 in | Wt 212.0 lb

## 2022-02-13 DIAGNOSIS — Z1211 Encounter for screening for malignant neoplasm of colon: Secondary | ICD-10-CM

## 2022-02-13 DIAGNOSIS — Z01419 Encounter for gynecological examination (general) (routine) without abnormal findings: Secondary | ICD-10-CM

## 2022-02-13 DIAGNOSIS — Z3041 Encounter for surveillance of contraceptive pills: Secondary | ICD-10-CM | POA: Diagnosis not present

## 2022-02-13 DIAGNOSIS — Z1212 Encounter for screening for malignant neoplasm of rectum: Secondary | ICD-10-CM | POA: Diagnosis not present

## 2022-02-13 LAB — HEMOCCULT GUIAC POC 1CARD (OFFICE): Fecal Occult Blood, POC: NEGATIVE

## 2022-02-13 MED ORDER — DESOGESTREL-ETHINYL ESTRADIOL 0.15-0.02/0.01 MG (21/5) PO TABS: 1.0000 | ORAL_TABLET | Freq: Every day | ORAL | 4 refills | Status: DC

## 2022-02-13 NOTE — Progress Notes (Signed)
Patient ID: Eileen Combs, female   DOB: 1975/03/23, 46 y.o.   MRN: [id number] History of Present Illness: Eileen Combs is a 46 year old black female, married, G5P0041 in for a well woman gyn exam and pap. She has had some urinary frequency, can't pee now. She is have surgery on knee next week for torn meniscus.  PCP is Delman Cheadle PA.  Current Medications, Allergies, Past Medical History, Past Surgical History, Family History and Social History were reviewed in [employer] record.     Review of Systems: Patient denies any headaches, hearing loss, fatigue, blurred vision, shortness of breath, chest pain, abdominal pain, problems with bowel movements(sometimes feels like gets stuck, and has to push in vagina),  or intercourse. No joint pain or mood swings.  +urinary frequency Does not have period every month on Kariva    Physical Exam:BP 134/86 (BP Location: Left Arm, Patient Position: Sitting, Cuff Size: Large)   Pulse 70   Ht 5\' 4"  (1.626 m)   Wt 212 lb (96.2 kg)   BMI 36.39 kg/m   General:  Well developed, well nourished, no acute distress Skin:  Warm and dry Neck:  Midline trachea, normal thyroid, good ROM, no lymphadenopathy Lungs; Clear to auscultation bilaterally Breast:  No dominant palpable mass, retraction, or nipple discharge Cardiovascular: Regular rate and rhythm Abdomen:  Soft, non tender, no hepatosplenomegaly Pelvic:  External genitalia is normal in appearance, no lesions.  The vagina is normal in appearance. Urethra has no lesions or masses. The cervix is bulbous.Pap with HR HPV genotyping performed.  Uterus is felt to be normal size, shape, and contour.  No adnexal masses or tenderness noted.Bladder is non tender, no masses felt. Rectal: Good sphincter tone, no polyps, or hemorrhoids felt.  Hemoccult negative.+mild rectocele Extremities/musculoskeletal:  No swelling or varicosities noted, no clubbing or cyanosis Psych:  No mood changes,  alert and cooperative,seems happy AA is 1 Fall risk is low    02/13/2022    2:36 PM 09/11/2020    1:40 PM 05/24/2019    2:29 PM  Depression screen PHQ 2/9  Decreased Interest 0 2 0  Down, Depressed, Hopeless 0 0 0  PHQ - 2 Score 0 2 0  Altered sleeping 2 3   Tired, decreased energy 0 1   Change in appetite 0 2   Feeling bad or failure about yourself  0 0   Trouble concentrating 0 1   Moving slowly or fidgety/restless 0 0   Suicidal thoughts 0 0   PHQ-9 Score 2 9        02/13/2022    2:36 PM 09/11/2020    1:40 PM  GAD 7 : Generalized Anxiety Score  Nervous, Anxious, on Edge 1 0  Control/stop worrying 0 0  Worry too much - different things 0 0  Trouble relaxing 0 0  Restless 0 0  Easily annoyed or irritable 0 0  Afraid - awful might happen 0 0  Total GAD 7 Score 1 0      Upstream - 02/13/22 1430       Pregnancy Intention Screening   Does the patient want to become pregnant in the next year? No    Does the patient's partner want to become pregnant in the next year? No    Would the patient like to discuss contraceptive options today? No      Contraception Wrap Up   Current Method Oral Contraceptive    End Method Oral Contraceptive  Examination chaperoned by Malachy Mood LPN  Impression and Plan:  1. Encounter for gynecological examination with Papanicolaou smear of cervix Pap sent Pap in 3 years if normal  Physical in 1 year Mammogram was negative 04/16/21 Labs with PCP  2. Encounter for screening fecal occult blood testing Hemoccult was negative  3. Screening for colorectal cancer Refereed to [facility] for colonoscopy Her grand ma  had rectal cancer     4. Encounter for surveillance of contraceptive pills Will refill Kariva Meds ordered this encounter  Medications   desogestrel-ethinyl estradiol (KARIVA) 0.15-0.02/0.01 MG (21/5) tablet    Sig: Take 1 tablet by mouth daily.    Dispense:  84 tablet    Refill:  4    Order Specific Question:    Supervising Provider    Answer:   Duane Lope H [[id number]]

## 2022-02-14 ENCOUNTER — Encounter: Payer: Self-pay | Admitting: *Deleted

## 2022-02-15 LAB — CYTOLOGY - PAP
Adequacy: ABSENT
Comment: NEGATIVE
Diagnosis: NEGATIVE
High risk HPV: NEGATIVE

## 2022-06-06 ENCOUNTER — Other Ambulatory Visit (HOSPITAL_COMMUNITY): Payer: Self-pay | Admitting: Adult Health

## 2022-06-06 DIAGNOSIS — Z1231 Encounter for screening mammogram for malignant neoplasm of breast: Secondary | ICD-10-CM

## 2022-06-24 ENCOUNTER — Encounter (HOSPITAL_COMMUNITY): Payer: Self-pay

## 2022-06-24 ENCOUNTER — Ambulatory Visit (HOSPITAL_COMMUNITY)
Admission: RE | Admit: 2022-06-24 | Discharge: 2022-06-24 | Disposition: A | Discharge status: Home / Self Care | Payer: Commercial Managed Care - PPO | Source: Ambulatory Visit | Attending: Adult Health | Admitting: Adult Health

## 2022-06-24 DIAGNOSIS — Z1231 Encounter for screening mammogram for malignant neoplasm of breast: Secondary | ICD-10-CM | POA: Insufficient documentation

## 2022-10-21 ENCOUNTER — Other Ambulatory Visit: Payer: Self-pay | Admitting: *Deleted

## 2022-10-21 DIAGNOSIS — Z1211 Encounter for screening for malignant neoplasm of colon: Secondary | ICD-10-CM

## 2022-10-22 ENCOUNTER — Other Ambulatory Visit: Payer: Self-pay | Admitting: Adult Health

## 2022-10-22 ENCOUNTER — Encounter: Payer: Self-pay | Admitting: Surgery

## 2022-10-22 ENCOUNTER — Ambulatory Visit: Payer: Commercial Managed Care - PPO | Admitting: Surgery

## 2022-10-22 VITALS — BP 136/87 | HR 73 | Temp 97.9°F | Resp 12 | Ht 64.0 in | Wt 229.0 lb

## 2022-10-22 DIAGNOSIS — Z1211 Encounter for screening for malignant neoplasm of colon: Secondary | ICD-10-CM | POA: Diagnosis not present

## 2022-10-22 MED ORDER — SUTAB 1479-225-188 MG PO TABS: ORAL_TABLET | ORAL | 0 refills | Status: AC

## 2022-10-23 NOTE — H&P (Signed)
[facility] History and Physical  Reason for Referral: Screening colonoscopy Referring Physician: Lenise Herald, PA-C  Chief Complaint   New Patient (Initial Visit)     Eileen Combs is a 47 y.o. female.  HPI: Patient presents to discuss screening colonoscopy.  She has never had a colonoscopy in the past.  She has no abdominal complaints, and denies any abdominal pain.  She is tolerating a diet without nausea and vomiting.  She denies any blood in her bowel movements, but she will occasionally note blood on toilet paper after having a hard stool.  She does have intermittent constipation and will occasionally use laxatives to help with her constipation.  She has a family history of rectal cancer in her maternal grandmother, who was diagnosed in her 70s.  She has a past medical history significant for sinus tachycardia on metoprolol, hyperlipidemia, and herpes.  She denies use of blood thinning medications.  Her surgical history is significant for cervical cerclage and knee surgery.  She denies use of tobacco products, alcohol, and illicit drugs.  Past Medical History:  Diagnosis Date   Abnormal pap    Abnormal Pap smear    Contraception management 10/13/2012   Herpes    Hyperlipidemia    Incompetent cervix    Lumpy breasts 06/20/2015   Sinus tachycardia    Vaginal Pap smear, abnormal     Past Surgical History:  Procedure Laterality Date   CERVICAL CERCLAGE     LEEP      Family History  Problem Relation Age of Onset   Hypertension Mother    Diabetes Mother    Cancer Maternal Grandmother        rectal   Diabetes Maternal Grandmother    Hypertension Maternal Grandmother    Heart disease Maternal Grandfather    Cancer Maternal Aunt        ovarian   Hypertension Maternal Aunt    Other Maternal Uncle        open heart surgery   Diabetes Other    Heart disease Other        CAD   Coronary artery disease Neg Hx     Social History   Tobacco Use    Smoking status: Never   Smokeless tobacco: Never   Tobacco comments:    NEVER USE SNUFF OR CHEW TOBACCO.  Vaping Use   Vaping status: Never Used  Substance Use Topics   Alcohol use: No   Drug use: No    Medications: I have reviewed the patient's current medications. Allergies as of 10/22/2022   No Known Allergies      Medication List        Accurate as of October 22, 2022 11:59 PM. If you have any questions, ask your nurse or doctor.          STOP taking these medications    pravastatin 40 MG tablet Commonly known as: PRAVACHOL Stopped by: Zuleyka Kloc A Ladene Allocca       TAKE these medications    acetaminophen 500 MG tablet Commonly known as: TYLENOL Take 500 mg by mouth every 6 (six) hours as needed.   atorvastatin 40 MG tablet Commonly known as: LIPITOR Take 40 mg by mouth daily.   ibuprofen 800 MG tablet Commonly known as: ADVIL Take 800 mg by mouth every 8 (eight) hours as needed.   Kariva 0.15-0.02/0.01 MG (21/5) tablet Generic drug: desogestrel-ethinyl estradiol TAKE 1 TABLET BY MOUTH EVERY DAY   metoprolol tartrate 25 MG tablet Commonly  known as: LOPRESSOR Take 2 pills in the morning and 1 in the Evening What changed:  how much to take when to take this additional instructions   Sutab [phone] MG Tabs Generic drug: Sodium Sulfate-Mag Sulfate-KCl Use as directed on form received in office Started by: Demitri Kucinski A Sorren Vallier   valACYclovir 500 MG tablet Commonly known as: VALTREX Take 500 mg by mouth daily.         ROS:  Constitutional: positive for chills, negative for fatigue and fevers Eyes: negative for visual disturbance and pain Ears, nose, mouth, throat, and face: negative for ear drainage, sore throat, and sinus problems Respiratory: negative for cough, wheezing, and shortness of breath Cardiovascular: negative for chest pain and palpitations Gastrointestinal: negative for abdominal pain, nausea, reflux symptoms, and  vomiting Genitourinary:positive for frequency, negative for dysuria Integument/breast: negative for dryness and rash Hematologic/lymphatic: negative for bleeding and lymphadenopathy Musculoskeletal:negative for back pain and neck pain Neurological: negative for dizziness and tremors Endocrine: negative for temperature intolerance  Blood pressure 136/87, pulse 73, temperature 97.9 F (36.6 C), temperature source Oral, resp. rate 12, height 5\' 4"  (1.626 m), weight 229 lb (103.9 kg), SpO2 98%. Physical Exam Vitals reviewed.  Constitutional:      Appearance: Normal appearance.  HENT:     Head: Normocephalic and atraumatic.  Eyes:     Extraocular Movements: Extraocular movements intact.     Pupils: Pupils are equal, round, and reactive to light.  Cardiovascular:     Rate and Rhythm: Normal rate and regular rhythm.  Pulmonary:     Effort: Pulmonary effort is normal.     Breath sounds: Normal breath sounds.  Abdominal:     Comments: Abdomen soft, nondistended, no percussion tenderness, non tender to palpation; no rigidity, guarding, or rebound tenderness  Musculoskeletal:        General: Normal range of motion.     Cervical back: Normal range of motion.  Skin:    General: Skin is warm and dry.  Neurological:     General: No focal deficit present.     Mental Status: She is alert and oriented to person, place, and time.  Psychiatric:        Mood and Affect: Mood normal.        Behavior: Behavior normal.     Results: No results found for this or any previous visit (from the past 48 hour(s)).  No results found.   Assessment & Plan:  Eileen Combs is a 47 y.o. female who presents for screening colonoscopy.  -I explained how we screen for colorectal cancer, and colonoscopy being one of the tools to screen for colorectal cancer. -The risk and benefits of colonoscopy were discussed including but not limited to bleeding, infection, missed lesions, and colon perforation requiring  surgery.  After careful consideration, [name] has decided to proceed with colonoscopy. -Patient tentatively scheduled for colonoscopy on 9/3 -Information provided to the patient regarding colonoscopies -We discussed the Sutab prep, and prescription and coupon for prep were provided to the patient  All questions were answered to the satisfaction of the patient.  Theophilus Kinds, DO [facility] [address] [place], [place] [address] [phone] (office)

## 2022-10-23 NOTE — Progress Notes (Signed)
[facility] History and Physical  Reason for Referral: Screening colonoscopy Referring Physician: Lenise Herald, PA-C  Chief Complaint   New Patient (Initial Visit)     Eileen Combs is a 47 y.o. female.  HPI: Patient presents to discuss screening colonoscopy.  She has never had a colonoscopy in the past.  She has no abdominal complaints, and denies any abdominal pain.  She is tolerating a diet without nausea and vomiting.  She denies any blood in her bowel movements, but she will occasionally note blood on toilet paper after having a hard stool.  She does have intermittent constipation and will occasionally use laxatives to help with her constipation.  She has a family history of rectal cancer in her maternal grandmother, who was diagnosed in her 70s.  She has a past medical history significant for sinus tachycardia on metoprolol, hyperlipidemia, and herpes.  She denies use of blood thinning medications.  Her surgical history is significant for cervical cerclage and knee surgery.  She denies use of tobacco products, alcohol, and illicit drugs.  Past Medical History:  Diagnosis Date   Abnormal pap    Abnormal Pap smear    Contraception management 10/13/2012   Herpes    Hyperlipidemia    Incompetent cervix    Lumpy breasts 06/20/2015   Sinus tachycardia    Vaginal Pap smear, abnormal     Past Surgical History:  Procedure Laterality Date   CERVICAL CERCLAGE     LEEP      Family History  Problem Relation Age of Onset   Hypertension Mother    Diabetes Mother    Cancer Maternal Grandmother        rectal   Diabetes Maternal Grandmother    Hypertension Maternal Grandmother    Heart disease Maternal Grandfather    Cancer Maternal Aunt        ovarian   Hypertension Maternal Aunt    Other Maternal Uncle        open heart surgery   Diabetes Other    Heart disease Other        CAD   Coronary artery disease Neg Hx     Social History   Tobacco Use    Smoking status: Never   Smokeless tobacco: Never   Tobacco comments:    NEVER USE SNUFF OR CHEW TOBACCO.  Vaping Use   Vaping status: Never Used  Substance Use Topics   Alcohol use: No   Drug use: No    Medications: I have reviewed the patient's current medications. Allergies as of 10/22/2022   No Known Allergies      Medication List        Accurate as of October 22, 2022 11:59 PM. If you have any questions, ask your nurse or doctor.          STOP taking these medications    pravastatin 40 MG tablet Commonly known as: PRAVACHOL Stopped by: Freddye Cardamone A Alton Tremblay       TAKE these medications    acetaminophen 500 MG tablet Commonly known as: TYLENOL Take 500 mg by mouth every 6 (six) hours as needed.   atorvastatin 40 MG tablet Commonly known as: LIPITOR Take 40 mg by mouth daily.   ibuprofen 800 MG tablet Commonly known as: ADVIL Take 800 mg by mouth every 8 (eight) hours as needed.   Kariva 0.15-0.02/0.01 MG (21/5) tablet Generic drug: desogestrel-ethinyl estradiol TAKE 1 TABLET BY MOUTH EVERY DAY   metoprolol tartrate 25 MG tablet Commonly  known as: LOPRESSOR Take 2 pills in the morning and 1 in the Evening What changed:  how much to take when to take this additional instructions   Sutab [phone] MG Tabs Generic drug: Sodium Sulfate-Mag Sulfate-KCl Use as directed on form received in office Started by: Uriel Horkey A Adien Kimmel   valACYclovir 500 MG tablet Commonly known as: VALTREX Take 500 mg by mouth daily.         ROS:  Constitutional: positive for chills, negative for fatigue and fevers Eyes: negative for visual disturbance and pain Ears, nose, mouth, throat, and face: negative for ear drainage, sore throat, and sinus problems Respiratory: negative for cough, wheezing, and shortness of breath Cardiovascular: negative for chest pain and palpitations Gastrointestinal: negative for abdominal pain, nausea, reflux symptoms, and  vomiting Genitourinary:positive for frequency, negative for dysuria Integument/breast: negative for dryness and rash Hematologic/lymphatic: negative for bleeding and lymphadenopathy Musculoskeletal:negative for back pain and neck pain Neurological: negative for dizziness and tremors Endocrine: negative for temperature intolerance  Blood pressure 136/87, pulse 73, temperature 97.9 F (36.6 C), temperature source Oral, resp. rate 12, height 5\' 4"  (1.626 m), weight 229 lb (103.9 kg), SpO2 98%. Physical Exam Vitals reviewed.  Constitutional:      Appearance: Normal appearance.  HENT:     Head: Normocephalic and atraumatic.  Eyes:     Extraocular Movements: Extraocular movements intact.     Pupils: Pupils are equal, round, and reactive to light.  Cardiovascular:     Rate and Rhythm: Normal rate and regular rhythm.  Pulmonary:     Effort: Pulmonary effort is normal.     Breath sounds: Normal breath sounds.  Abdominal:     Comments: Abdomen soft, nondistended, no percussion tenderness, non tender to palpation; no rigidity, guarding, or rebound tenderness  Musculoskeletal:        General: Normal range of motion.     Cervical back: Normal range of motion.  Skin:    General: Skin is warm and dry.  Neurological:     General: No focal deficit present.     Mental Status: She is alert and oriented to person, place, and time.  Psychiatric:        Mood and Affect: Mood normal.        Behavior: Behavior normal.     Results: No results found for this or any previous visit (from the past 48 hour(s)).  No results found.   Assessment & Plan:  Eileen Combs is a 47 y.o. female who presents for screening colonoscopy.  -I explained how we screen for colorectal cancer, and colonoscopy being one of the tools to screen for colorectal cancer. -The risk and benefits of colonoscopy were discussed including but not limited to bleeding, infection, missed lesions, and colon perforation requiring  surgery.  After careful consideration, Eileen Combs has decided to proceed with colonoscopy. -Patient tentatively scheduled for colonoscopy on 9/3 -Information provided to the patient regarding colonoscopies -We discussed the Sutab prep, and prescription and coupon for prep were provided to the patient  All questions were answered to the satisfaction of the patient.  Theophilus Kinds, DO [facility] [address] [place], [place] [address] [phone] (office)

## 2022-10-25 ENCOUNTER — Other Ambulatory Visit: Payer: Self-pay | Admitting: *Deleted

## 2022-10-25 DIAGNOSIS — Z1211 Encounter for screening for malignant neoplasm of colon: Secondary | ICD-10-CM

## 2022-11-05 ENCOUNTER — Encounter (HOSPITAL_COMMUNITY)
Admission: RE | Disposition: A | Discharge status: Home / Self Care | Payer: Self-pay | Source: Home / Self Care | Attending: Surgery

## 2022-11-05 ENCOUNTER — Ambulatory Visit (HOSPITAL_COMMUNITY)
Admission: RE | Admit: 2022-11-05 | Discharge: 2022-11-05 | Disposition: A | Discharge status: Home / Self Care | Payer: Commercial Managed Care - PPO | Attending: Surgery | Admitting: Surgery

## 2022-11-05 ENCOUNTER — Other Ambulatory Visit: Payer: Self-pay

## 2022-11-05 ENCOUNTER — Ambulatory Visit (HOSPITAL_BASED_OUTPATIENT_CLINIC_OR_DEPARTMENT_OTHER): Payer: Commercial Managed Care - PPO | Admitting: Anesthesiology

## 2022-11-05 ENCOUNTER — Encounter (HOSPITAL_COMMUNITY): Payer: Self-pay | Admitting: Surgery

## 2022-11-05 ENCOUNTER — Ambulatory Visit (HOSPITAL_COMMUNITY): Payer: Commercial Managed Care - PPO | Admitting: Anesthesiology

## 2022-11-05 DIAGNOSIS — Z1212 Encounter for screening for malignant neoplasm of rectum: Secondary | ICD-10-CM

## 2022-11-05 DIAGNOSIS — E785 Hyperlipidemia, unspecified: Secondary | ICD-10-CM | POA: Diagnosis not present

## 2022-11-05 DIAGNOSIS — R195 Other fecal abnormalities: Secondary | ICD-10-CM | POA: Diagnosis not present

## 2022-11-05 DIAGNOSIS — Z79899 Other long term (current) drug therapy: Secondary | ICD-10-CM | POA: Diagnosis not present

## 2022-11-05 DIAGNOSIS — K644 Residual hemorrhoidal skin tags: Secondary | ICD-10-CM | POA: Diagnosis not present

## 2022-11-05 DIAGNOSIS — R000 Tachycardia, unspecified: Secondary | ICD-10-CM | POA: Diagnosis not present

## 2022-11-05 DIAGNOSIS — K5900 Constipation, unspecified: Secondary | ICD-10-CM | POA: Diagnosis not present

## 2022-11-05 DIAGNOSIS — B009 Herpesviral infection, unspecified: Secondary | ICD-10-CM | POA: Insufficient documentation

## 2022-11-05 DIAGNOSIS — Z1211 Encounter for screening for malignant neoplasm of colon: Secondary | ICD-10-CM | POA: Diagnosis not present

## 2022-11-05 DIAGNOSIS — K649 Unspecified hemorrhoids: Secondary | ICD-10-CM

## 2022-11-05 DIAGNOSIS — Z800 Family history of malignant neoplasm of digestive organs: Secondary | ICD-10-CM | POA: Insufficient documentation

## 2022-11-05 HISTORY — PX: COLONOSCOPY WITH PROPOFOL: SHX5780

## 2022-11-05 LAB — POCT PREGNANCY, URINE: Preg Test, Ur: NEGATIVE

## 2022-11-05 SURGERY — COLONOSCOPY WITH PROPOFOL
Anesthesia: General

## 2022-11-05 MED ORDER — LIDOCAINE HCL (CARDIAC) PF 100 MG/5ML IV SOSY
PREFILLED_SYRINGE | INTRAVENOUS | Status: DC | PRN
  Administered 2022-11-05: 80 mg via INTRAVENOUS

## 2022-11-05 MED ORDER — PROPOFOL 10 MG/ML IV BOLUS
INTRAVENOUS | Status: DC | PRN
  Administered 2022-11-05: 100 mg via INTRAVENOUS

## 2022-11-05 MED ORDER — LIDOCAINE HCL (PF) 2 % IJ SOLN
INTRAMUSCULAR | Status: AC
  Filled 2022-11-05: qty 10

## 2022-11-05 MED ORDER — PROPOFOL 1000 MG/100ML IV EMUL
INTRAVENOUS | Status: AC
  Filled 2022-11-05: qty 100

## 2022-11-05 MED ORDER — LACTATED RINGERS IV SOLN: INTRAVENOUS | Status: DC

## 2022-11-05 MED ORDER — PROPOFOL 500 MG/50ML IV EMUL
INTRAVENOUS | Status: DC | PRN
  Administered 2022-11-05: 150 ug/kg/min via INTRAVENOUS

## 2022-11-05 NOTE — Anesthesia Preprocedure Evaluation (Signed)
Anesthesia Evaluation  Patient identified by MRN, date of birth, ID band Patient awake    Reviewed: Allergy & Precautions, H&P , NPO status , Patient's Chart, lab work & pertinent test results, reviewed documented beta blocker date and time   Airway Mallampati: II  TM Distance: >3 FB Neck ROM: full    Dental no notable dental hx.    Pulmonary neg pulmonary ROS   Pulmonary exam normal breath sounds clear to auscultation       Cardiovascular Exercise Tolerance: Good negative cardio ROS  Rhythm:regular Rate:Normal     Neuro/Psych negative neurological ROS  negative psych ROS   GI/Hepatic negative GI ROS, Neg liver ROS,,,  Endo/Other  negative endocrine ROS    Renal/GU negative Renal ROS  negative genitourinary   Musculoskeletal   Abdominal   Peds  Hematology negative hematology ROS (+)   Anesthesia Other Findings   Reproductive/Obstetrics negative OB ROS                             Anesthesia Physical Anesthesia Plan  ASA: 2  Anesthesia Plan: General   Post-op Pain Management:    Induction:   PONV Risk Score and Plan: Propofol infusion  Airway Management Planned:   Additional Equipment:   Intra-op Plan:   Post-operative Plan:   Informed Consent: I have reviewed the patients History and Physical, chart, labs and discussed the procedure including the risks, benefits and alternatives for the proposed anesthesia with the patient or authorized representative who has indicated his/her understanding and acceptance.     Dental Advisory Given  Plan Discussed with: CRNA  Anesthesia Plan Comments:        Anesthesia Quick Evaluation  

## 2022-11-05 NOTE — Op Note (Signed)
[facility] Patient Name: Eileen Combs Procedure Date: 11/05/2022 7:01 AM MRN: [id number] Date of Birth: Feb 02, 1976 Attending MD: Gustavus Messing Zimir Kittleson DO, ,  CSN: [id number] Age: 47 Admit Type: Outpatient Procedure:                Colonoscopy Indications:              Screening for colorectal malignant neoplasm Providers:                Santina Evans A. Maressa Apollo DO, Chip Brackin IT,                            Admin., Angela A. Thomasena Edis RN, RN, Pandora Leiter,                            Technician Referring MD:              Medicines:                Monitored Anesthesia Care Complications:            No immediate complications. Estimated Blood Loss:     Estimated blood loss: none. Procedure:                Pre-Anesthesia Assessment:                           - Prior to the procedure, a History and Physical                            was performed, and patient medications, allergies                            and sensitivities were reviewed. The patient's                            tolerance of previous anesthesia was reviewed.                           - The risks and benefits of the procedure and the                            sedation options and risks were discussed with the                            patient. All questions were answered and informed                            consent was obtained.                           - Monitored anesthesia care under the supervision                            of a CRNA was determined to be medically necessary                            for this  procedure based on review of the patient's                            medical history, medications, and prior anesthesia                            history.                           After obtaining informed consent, the colonoscope                            was passed under direct vision. Throughout the                            procedure, the patient's blood pressure, pulse, and                             oxygen saturations were monitored continuously. The                            [id number]-[id number] ([id number]) scope was introduced through                            the anus and advanced to the the cecum, identified                            by the ileocecal valve. The colonoscopy was                            performed without difficulty. The patient tolerated                            the procedure well. The quality of the bowel                            preparation was evaluated using the BBPS [facility]                            Bowel Preparation Scale) with scores of: Right                            Colon = 0 (unprepared, mucosa not seen due to solid                            stool that cannot be cleared or unseen proximal                            colon segment in a colonoscopy aborted due to                            inadequate bowel prep), Transverse Colon = 2 (minor  amount of residual staining, small fragments of                            stool and/or opaque liquid, but mucosa seen well)                            and Left Colon = 3 (entire mucosa seen well with no                            residual staining, small fragments of stool or                            opaque liquid). The total BBPS score equals 5.                            Suboptimal prep. Scope In: 7:38:29 AM Scope Out: 7:58:37 AM Scope Withdrawal Time: 0 hours 9 minutes 39 seconds  Total Procedure Duration: 0 hours 20 minutes 8 seconds  Findings:      Hemorrhoids were found on perianal exam.      The exam was otherwise without abnormality on direct and retroflexion       views.      A moderate amount of stool was found in the ascending colon and in the       cecum, interfering with visualization. Impression:               - Hemorrhoids found on perianal exam.                           - The examination was otherwise normal on direct                            and  retroflexion views.                           - Stool in the ascending colon and in the cecum.                           - No specimens collected. Moderate Sedation:      An independent trained observer was present and continuously monitored       the patient. Recommendation:           - Discharge patient to home.                           - Resume regular diet.                           - Continue present medications.                           - Repeat colonoscopy 3-6 months because the bowel                            preparation was poor. Procedure Code(s):        --- Professional ---  40981, Colonoscopy, flexible; diagnostic, including                            collection of specimen(s) by brushing or washing,                            when performed (separate procedure) Diagnosis Code(s):        --- Professional ---                           Z12.11, Encounter for screening for malignant                            neoplasm of colon                           K64.9, Unspecified hemorrhoids CPT copyright 2022 American Medical Association. All rights reserved. The codes documented in this report are preliminary and upon coder review may  be revised to meet current compliance requirements. Santina Evans A Tavares Levinson DO,  11/05/2022 8:20:36 AM Number of Addenda: 0

## 2022-11-05 NOTE — Interval H&P Note (Signed)
History and Physical Interval Note:  11/05/2022 7:27 AM  Eileen Combs  has presented today for surgery, with the diagnosis of SCREENING FOR COLON CANCER.  The various methods of treatment have been discussed with the patient and family. After consideration of risks, benefits and other options for treatment, the patient has consented to  Procedure(s): COLONOSCOPY WITH PROPOFOL (N/A) as a surgical intervention.  The patient's history has been reviewed, patient examined, no change in status, stable for surgery.  I have reviewed the patient's chart and labs.  Questions were answered to the patient's satisfaction.     Lavere Stork A Neymar Dowe

## 2022-11-05 NOTE — Transfer of Care (Signed)
Immediate Anesthesia Transfer of Care Note  Patient: Eileen Combs  Procedure(s) Performed: COLONOSCOPY WITH PROPOFOL  Patient Location: Short Stay  Anesthesia Type:General  Level of Consciousness: drowsy and patient cooperative  Airway & Oxygen Therapy: Patient Spontanous Breathing and Patient connected to nasal cannula oxygen  Post-op Assessment: Report given to RN and Post -op Vital signs reviewed and stable  Post vital signs: Reviewed and stable  Last Vitals:  Vitals Value Taken Time  BP 103/66 11/05/22 0802  Temp    Pulse 85 11/05/22 0802  Resp 18 11/05/22 0802  SpO2 99 % 11/05/22 0802    Last Pain:  Vitals:   11/05/22 0802  TempSrc: Oral  PainSc: 0-No pain      Patients Stated Pain Goal: 7 (11/05/22 0651)  Complications: No notable events documented.

## 2022-11-05 NOTE — OR Nursing (Signed)
Eileen Combs was at [facility] on 11/05/22 and cannot return to work until 9:00am on 11/06/22.

## 2022-11-06 NOTE — Anesthesia Postprocedure Evaluation (Signed)
Anesthesia Post Note  Patient: Eileen Combs  Procedure(s) Performed: COLONOSCOPY WITH PROPOFOL  Patient location during evaluation: Phase II Anesthesia Type: General Level of consciousness: awake Pain management: pain level controlled Vital Signs Assessment: post-procedure vital signs reviewed and stable Respiratory status: spontaneous breathing and respiratory function stable Cardiovascular status: blood pressure returned to baseline and stable Postop Assessment: no headache and no apparent nausea or vomiting Anesthetic complications: no Comments: Late entry   No notable events documented.   Last Vitals:  Vitals:   11/05/22 0651 11/05/22 0802  BP: 134/89 103/66  Pulse: 87 85  Resp: 17 18  Temp: 36.6 C   SpO2: 100% 99%    Last Pain:  Vitals:   11/05/22 0802  TempSrc: Oral  PainSc: 0-No pain                 Windell Norfolk

## 2022-11-07 ENCOUNTER — Encounter (HOSPITAL_COMMUNITY): Payer: Self-pay | Admitting: Surgery

## 2022-11-20 ENCOUNTER — Ambulatory Visit: Payer: Commercial Managed Care - PPO | Admitting: Surgery

## 2022-11-20 DIAGNOSIS — Z1211 Encounter for screening for malignant neoplasm of colon: Secondary | ICD-10-CM

## 2022-11-20 NOTE — Progress Notes (Signed)
[facility]  I am calling the patient for to discuss the results of her colonoscopy.  I explained that her ascending colon had formed stool present, which prevented adequate visualization of this area.  We discussed that the recommendation is to repeat the colonoscopy in 3 months to fully evaluate this area.  At the time of repeat colonoscopy, I would recommend that she does clear liquids for 2 days prior to the procedure in addition to a full mechanical bowel prep.  The patient would like to repeat the colonoscopy in 6 months if possible.  She does understand that since I could not adequately visualize the entire colon on the right side, that there could be an underlying malignancy that we did not visualize.  We will tentatively schedule the patient for repeat colonoscopy on 05/09/2023.  She will also follow-up with me 2 weeks prior to discuss any issues and for me to give her prescriptions for the prep.  All questions were answered to the patient's expressed satisfaction.  Theophilus Kinds, DO [facility] [address] [place], [place] [address] [phone] (office)

## 2023-05-29 ENCOUNTER — Other Ambulatory Visit: Payer: Self-pay | Admitting: Adult Health

## 2023-09-04 ENCOUNTER — Other Ambulatory Visit (HOSPITAL_COMMUNITY): Payer: Self-pay | Admitting: Adult Health

## 2023-09-04 DIAGNOSIS — Z1231 Encounter for screening mammogram for malignant neoplasm of breast: Secondary | ICD-10-CM

## 2023-09-17 ENCOUNTER — Encounter (HOSPITAL_COMMUNITY): Payer: Self-pay

## 2023-09-17 ENCOUNTER — Ambulatory Visit (HOSPITAL_COMMUNITY)
Admission: RE | Admit: 2023-09-17 | Discharge: 2023-09-17 | Disposition: A | Discharge status: Home / Self Care | Source: Ambulatory Visit | Attending: Adult Health | Admitting: Adult Health

## 2023-09-17 DIAGNOSIS — Z1231 Encounter for screening mammogram for malignant neoplasm of breast: Secondary | ICD-10-CM | POA: Diagnosis present

## 2023-09-24 ENCOUNTER — Ambulatory Visit: Payer: Self-pay | Admitting: Adult Health

## 2023-11-20 ENCOUNTER — Other Ambulatory Visit: Payer: Self-pay | Admitting: Adult Health

## 2024-01-15 IMAGING — MG MM DIGITAL SCREENING BILAT W/ TOMO AND CAD
8 series · 8 of 24 positions shown · non-contrast
Comparison: Previous exam(s).

CLINICAL DATA: Screening.

EXAM:
DIGITAL SCREENING BILATERAL MAMMOGRAM WITH TOMOSYNTHESIS AND CAD
TECHNIQUE: Bilateral screening digital craniocaudal and mediolateral oblique
mammograms were obtained. Bilateral screening digital breast
tomosynthesis was performed. The images were evaluated with
computer-aided detection.

[L MLO synth-2D]
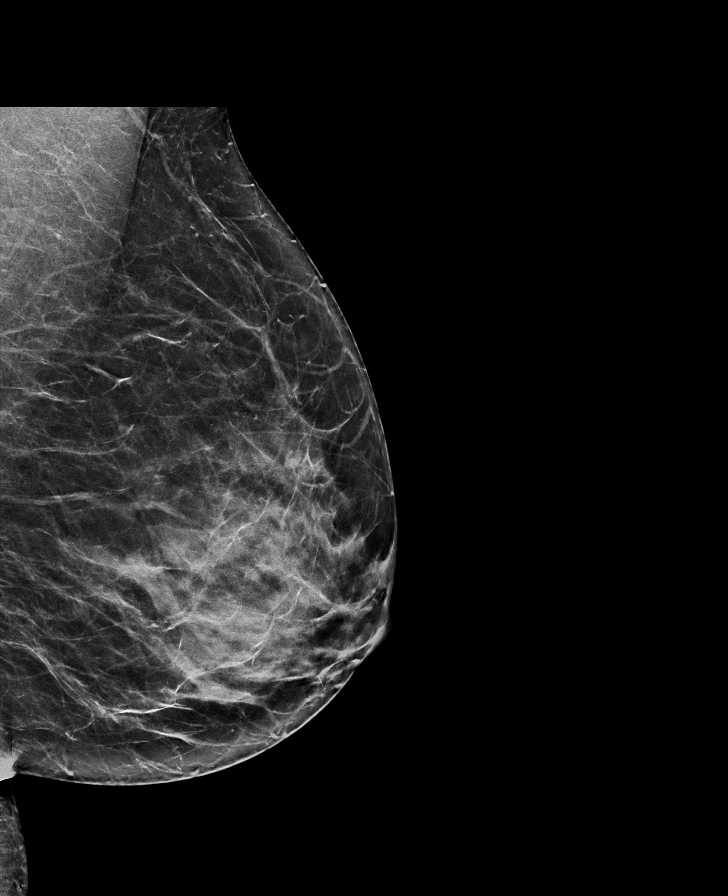

[R CC synth-2D]
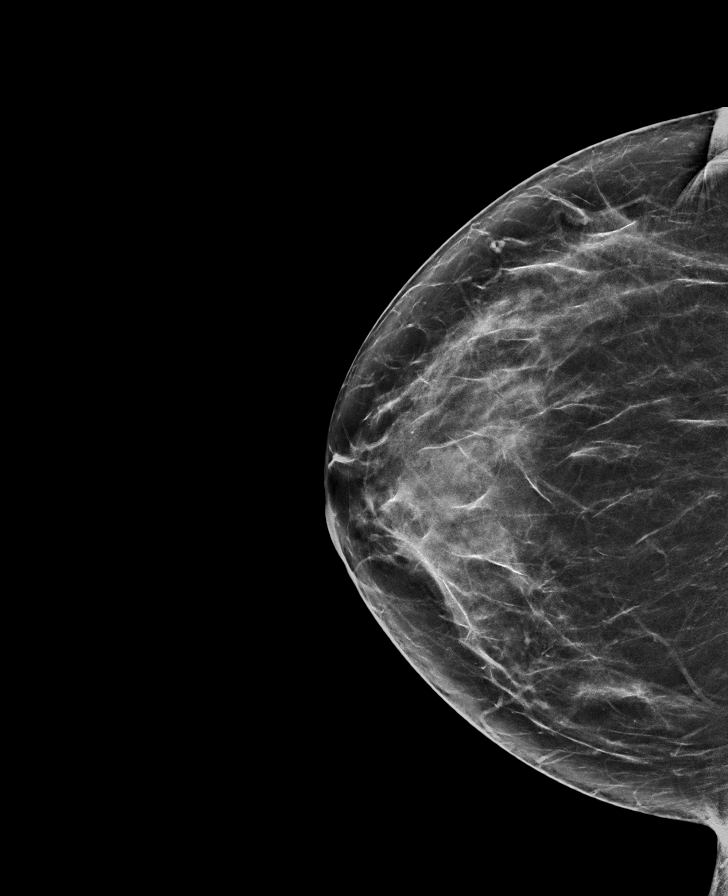

[L CC synth-2D]
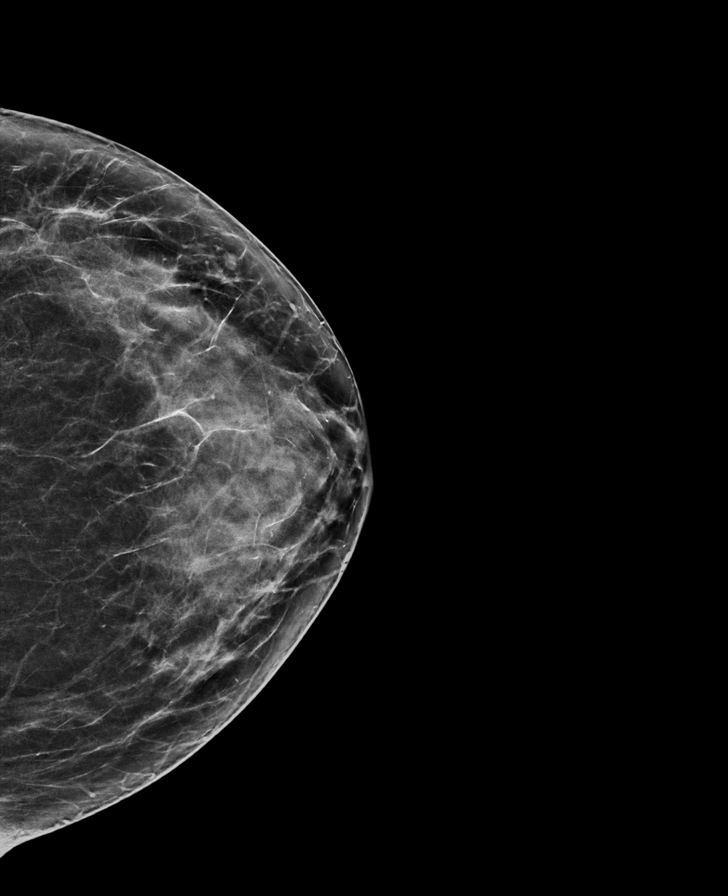

[R MLO synth-2D]
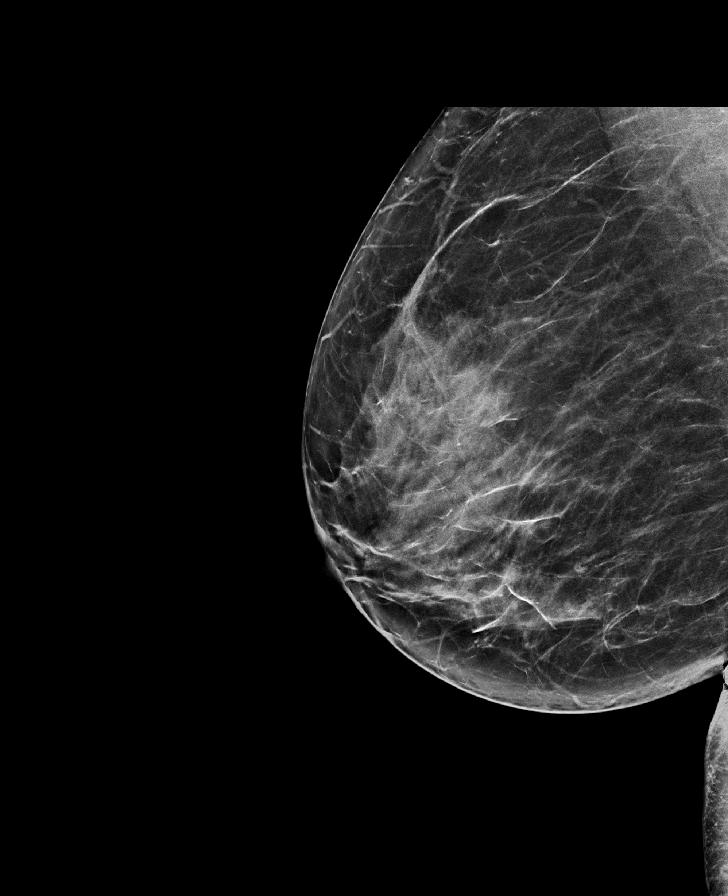

[R CC tomo · tomo slice 37/73.0]
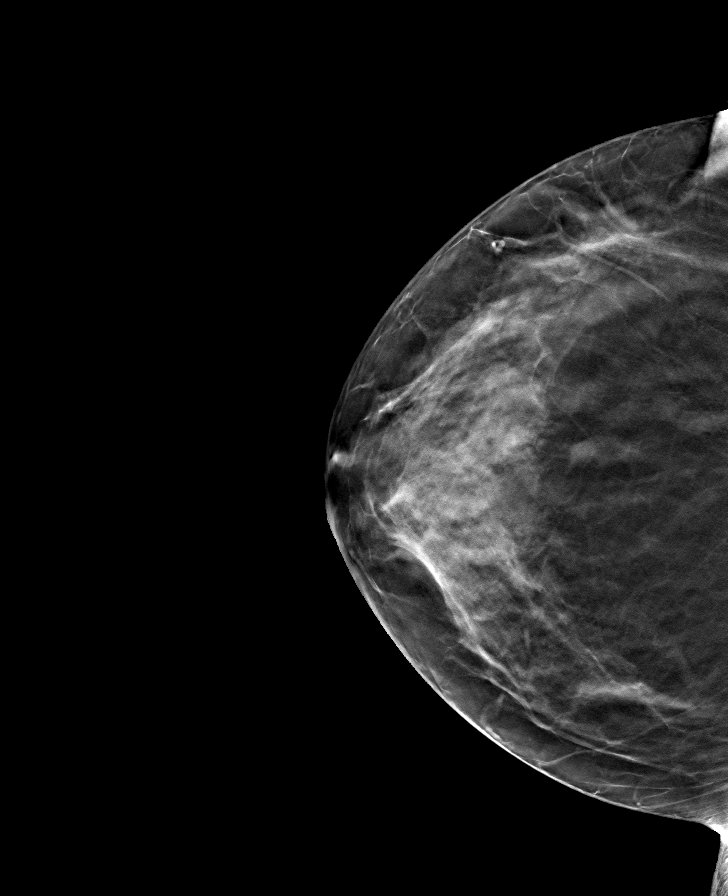

[L MLO tomo · tomo slice 35/69.0]
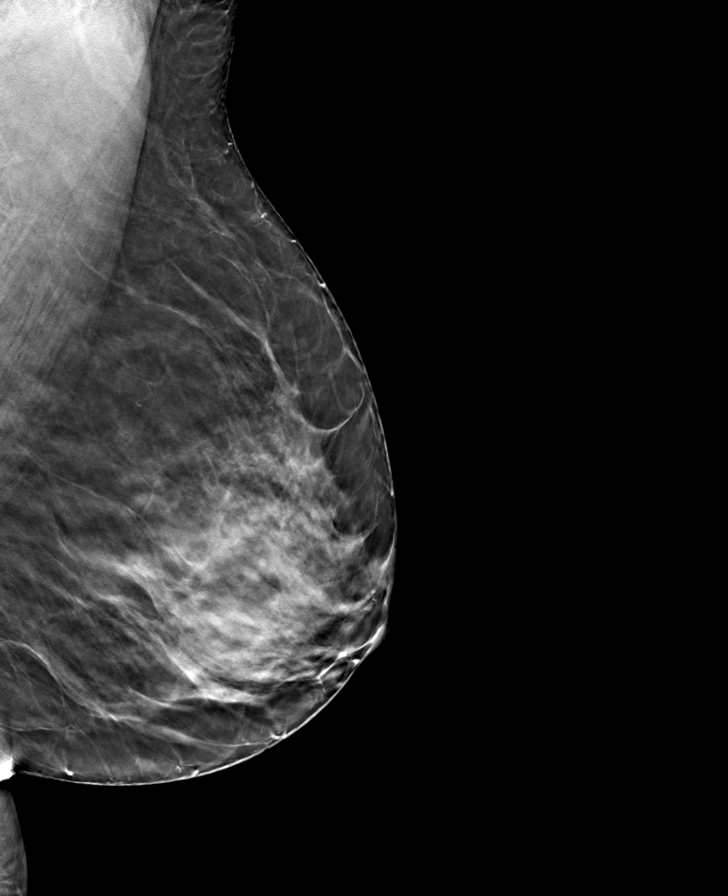

[R MLO tomo · tomo slice 37/72.0]
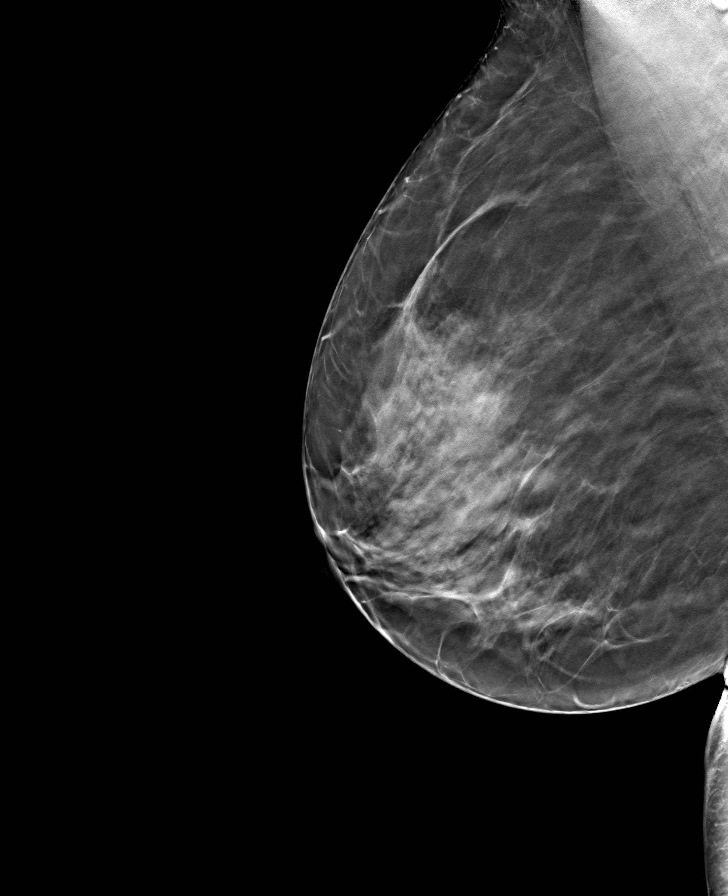

[L CC tomo · tomo slice 33/66.0]
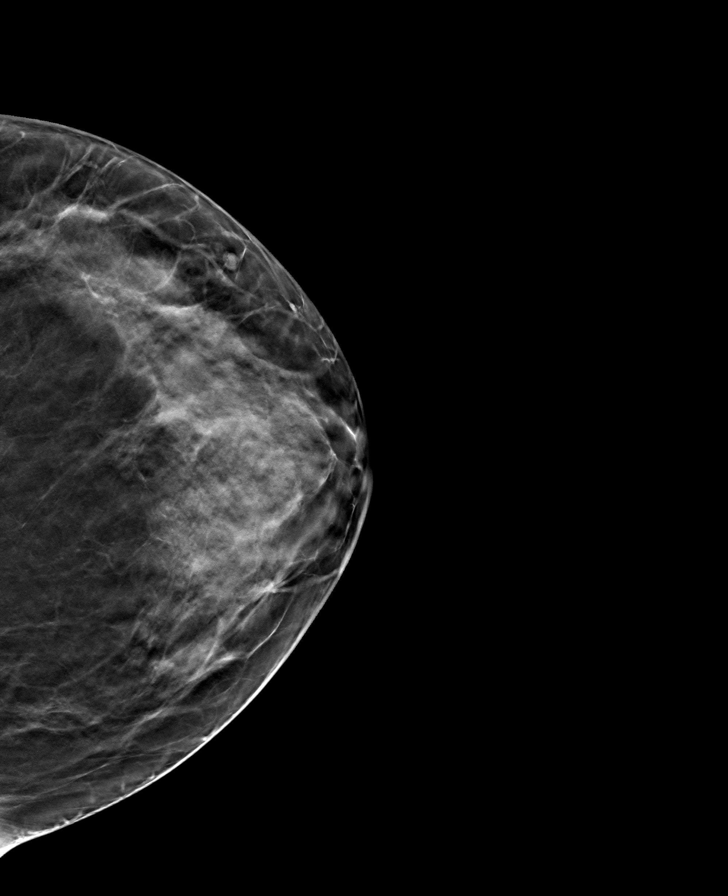

[8 of 24 positions shown; findings below may reference images not displayed]

ACR Breast Density Category c: The breast tissue is heterogeneously
dense, which may obscure small masses.
FINDINGS: There are no findings suspicious for malignancy.
IMPRESSION: No mammographic evidence of malignancy. A result letter of this
screening mammogram will be mailed directly to the patient.

RECOMMENDATION:
Screening mammogram in one year. (Code:[id number])

BI-RADS CATEGORY  1: Negative.
